# Patient Record
Sex: Female | Born: 1985 | Hispanic: Yes | Marital: Married | Smoking: Never smoker
Health system: Southern US, Community
[De-identification: ages and names within clinical notes are randomized; demographics above are authoritative.]

## PROBLEM LIST (undated history)

## (undated) HISTORY — PX: GASTRIC BYPASS: SHX52

## (undated) HISTORY — PX: CHOLECYSTECTOMY: SHX55

## (undated) HISTORY — PX: TONSILLECTOMY: SUR1361

## (undated) HISTORY — PX: APPENDECTOMY: SHX54

---

## 2020-05-09 ENCOUNTER — Emergency Department
Admission: EM | Admit: 2020-05-09 | Discharge: 2020-05-09 | Disposition: A | Discharge status: Other Acute Inpatient Hospital | Payer: Self-pay

## 2020-05-10 ENCOUNTER — Other Ambulatory Visit: Payer: Self-pay

## 2020-05-10 ENCOUNTER — Ambulatory Visit (INDEPENDENT_AMBULATORY_CARE_PROVIDER_SITE_OTHER): Payer: Self-pay

## 2020-05-10 ENCOUNTER — Encounter: Payer: Self-pay | Admitting: Emergency Medicine

## 2020-05-10 ENCOUNTER — Ambulatory Visit
Admission: EM | Admit: 2020-05-10 | Discharge: 2020-05-10 | Disposition: A | Discharge status: Home / Self Care | Payer: Self-pay

## 2020-05-10 DIAGNOSIS — S93402A Sprain of unspecified ligament of left ankle, initial encounter: Secondary | ICD-10-CM

## 2020-05-10 MED ORDER — IBUPROFEN 800 MG PO TABS
800.0000 mg | ORAL_TABLET | Freq: Three times a day (TID) | ORAL | 0 refills | Status: DC
Start: 2020-05-10 — End: 2023-05-22

## 2020-05-10 NOTE — ED Provider Notes (Signed)
[facility]    CSN: [id number] Arrival date & time: 05/10/20  0935      History   Chief Complaint Chief Complaint  Patient presents with  . Foot Pain  . Fall  . Ankle Pain    HPI Christina Park is a 34 y.o. female. who presents with L ankle and foot  pain after twisting her ankle at home. She is moving and jumped over some items and as she landed her ankle twisted inwards.     History reviewed. No pertinent past medical history.  There are no problems to display for this patient.   Past Surgical History:  Procedure Laterality Date  . APPENDECTOMY    . CHOLECYSTECTOMY    . GASTRIC BYPASS    . TONSILLECTOMY      OB History   No obstetric history on file.      Home Medications    Prior to Admission medications   Medication Sig Start Date End Date Taking? Authorizing Provider  levonorgestrel (MIRENA) 20 MCG/24HR IUD 1 each by Intrauterine route once.   Yes [provider]  Multiple Vitamin (MULTIVITAMIN) capsule Take 1 capsule by mouth daily.   Yes [provider]  ibuprofen (ADVIL) 800 MG tablet Take 1 tablet (800 mg total) by mouth 3 (three) times daily. 05/10/20   Rodriguez-Southworth, Nettie Elm, PA-C    Family History Family History  Problem Relation Age of Onset  . Healthy Mother   . Healthy Father     Social History Social History   Tobacco Use  . Smoking status: Never Smoker  . Smokeless tobacco: Never Used  Vaping Use  . Vaping Use: Never used  Substance Use Topics  . Alcohol use: Not Currently  . Drug use: Never     Allergies   Patient has no known allergies.   Review of Systems Review of Systems  Musculoskeletal: Positive for arthralgias and gait problem. Negative for back pain, joint swelling, myalgias and neck pain.  Skin: Negative for color change, pallor, rash and wound.  Neurological: Negative for numbness.     Physical Exam Triage Vital Signs ED Triage Vitals  Enc Vitals Group     BP  05/10/20 0951 109/76     Pulse Rate 05/10/20 0951 91     Resp 05/10/20 0951 14     Temp 05/10/20 0951 98 F (36.7 C)     Temp Source 05/10/20 0951 Oral     SpO2 05/10/20 0951 100 %     Weight 05/10/20 0947 200 lb (90.7 kg)     Height 05/10/20 0947 5' 4.96" (1.65 m)     Head Circumference --      Peak Flow --      Pain Score 05/10/20 0947 6     Pain Loc --      Pain Edu? --      Excl. in GC? --    No data found.  Updated Vital Signs BP 109/76 (BP Location: Left Arm)   Pulse 91   Temp 98 F (36.7 C) (Oral)   Resp 14   Ht 5' 4.96" (1.65 m)   Wt 200 lb (90.7 kg)   SpO2 100%   BMI 33.32 kg/m   Visual Acuity Right Eye Distance:   Left Eye Distance:   Bilateral Distance:    Right Eye Near:   Left Eye Near:    Bilateral Near:     Physical Exam Vitals and nursing note reviewed.  Constitutional:  General: She is not in acute distress.    Appearance: She is obese. She is not toxic-appearing.     Comments: She is sitting on a wheelchair since she cant bare weight due to the pain.   HENT:     Head: Atraumatic.     Right Ear: External ear normal.     Left Ear: External ear normal.  Eyes:     General: No scleral icterus.    Conjunctiva/sclera: Conjunctivae normal.  Cardiovascular:     Pulses: Normal pulses.  Pulmonary:     Effort: Pulmonary effort is normal.  Musculoskeletal:        General: Swelling present.     Cervical back: Neck supple.     Comments: L ANKLE- with moderate swelling on the lateral malleolus which is very tender to palpation. ROM is limited due to pain.  L FOOT- has mild pain in the dorsal proximal region of the foot, but the rest is negative.   Skin:    General: Skin is warm and dry.     Capillary Refill: Capillary refill takes less than 2 seconds.     Findings: Bruising present. No rash.     Comments: Faint bruising noted on lateral malleolus  Neurological:     Mental Status: She is alert and oriented to person, place, and time.     Gait:  Gait abnormal.     Deep Tendon Reflexes: Reflexes normal.  Psychiatric:        Mood and Affect: Mood normal.        Behavior: Behavior normal.        Thought Content: Thought content normal.        Judgment: Judgment normal.      UC Treatments / Results  Labs (all labs ordered are listed, but only abnormal results are displayed) Labs Reviewed - No data to display  EKG   Radiology DG Ankle Complete Left  Result Date: 05/10/2020 CLINICAL DATA:  Left foot and left ankle pain and swelling. Status post fall EXAM: LEFT ANKLE COMPLETE - 3+ VIEW COMPARISON:  None. FINDINGS: There is lateral soft tissue swelling overlying the lateral malleolus. No underlying fracture or dislocation. No radio-opaque foreign bodies. IMPRESSION: Lateral soft tissue swelling.  No underlying fracture. Electronically Signed   By: Signa Kell M.D.   On: 05/10/2020 10:35   DG Foot Complete Left  Result Date: 05/10/2020 CLINICAL DATA:  Status post fall yesterday now with foot and ankle swelling. EXAM: LEFT FOOT - COMPLETE 3+ VIEW COMPARISON:  None. FINDINGS: Diffuse soft tissue swelling is identified. No underlying fracture or subluxation identified. No radiopaque foreign bodies. IMPRESSION: Diffuse soft tissue swelling.  No fracture identified. Electronically Signed   By: Signa Kell M.D.   On: 05/10/2020 10:36    Procedures Procedures (including critical care time)  Medications Ordered in UC Medications - No data to display  Initial Impression / Assessment and Plan / UC Course  I have reviewed the triage vital signs and the nursing notes. Pertinent  imaging results that were available during my care of the patient were reviewed by me and considered in my medical decision making (see chart for details). Has L ankle sprain. She was placed on ankle brace and given crutches. Advised to gradually bare weight as tolerated. Also taught how to do some ankle ROM exercises.  Final Clinical Impressions(s) / UC  Diagnoses   Final diagnoses:  Sprain of left ankle, unspecified ligament, initial encounter     Discharge Instructions  Apply ice for 20 minutes over a cloth 3-4 times a day for 2 days. Rest and elevate the affected painful area.  As pain recedes, begin normal activities slowly as tolerated.   Go to [facility] if symptoms worsen.     ED Prescriptions    Medication Sig Dispense Auth. Provider   ibuprofen (ADVIL) 800 MG tablet Take 1 tablet (800 mg total) by mouth 3 (three) times daily. 21 tablet Rodriguez-Southworth, Nettie Elm, PA-C     PDMP not reviewed this encounter.   Garey Ham, [place] 05/11/20 8295

## 2020-05-10 NOTE — ED Triage Notes (Signed)
Patient states that she fell at her house yesterday and twisted her left ankle.  Patient c/o pain in her left ankle and left foot.

## 2020-05-10 NOTE — Discharge Instructions (Addendum)
Apply ice for 20 minutes over a cloth 3-4 times a day for 2 days. Rest and elevate the affected painful area.  As pain recedes, begin normal activities slowly as tolerated.   Go to [facility] if symptoms worsen.

## 2021-02-11 IMAGING — CR DG ANKLE COMPLETE 3+V*L*
3 series · 3 of 3 positions shown · non-contrast
Comparison: None.

CLINICAL DATA: Left foot and left ankle pain and swelling. Status
post fall

EXAM:
LEFT ANKLE COMPLETE - 3+ VIEW

[ankle ap]
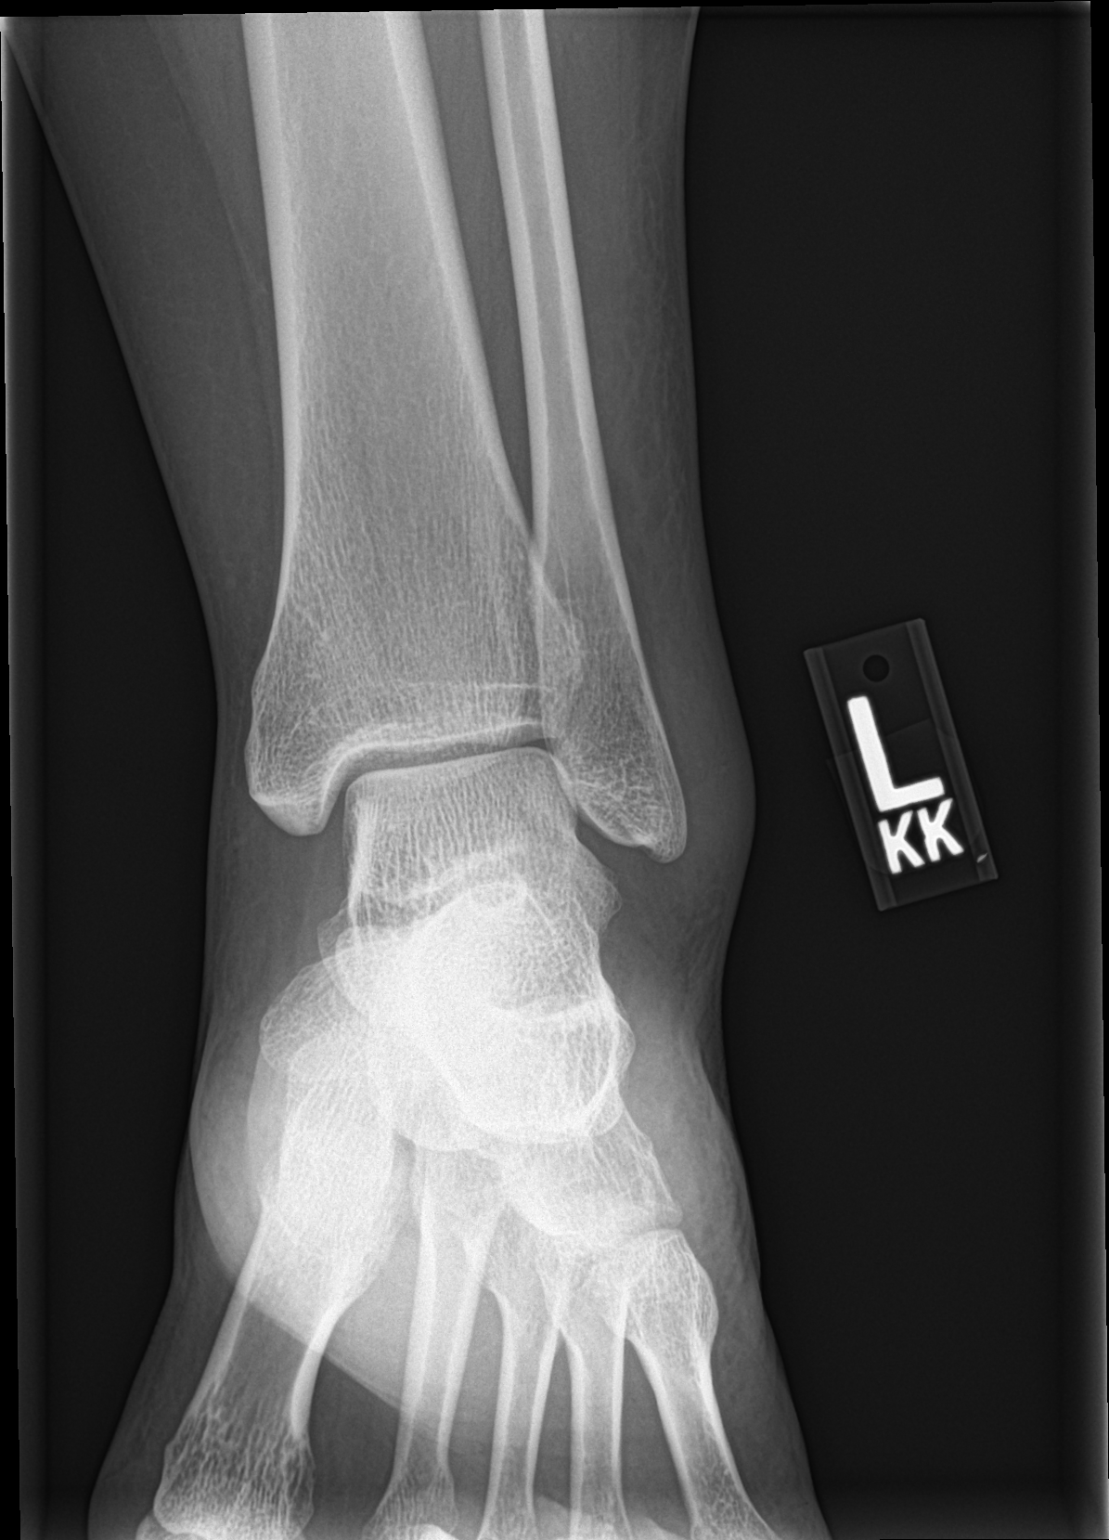

[ankle obl]
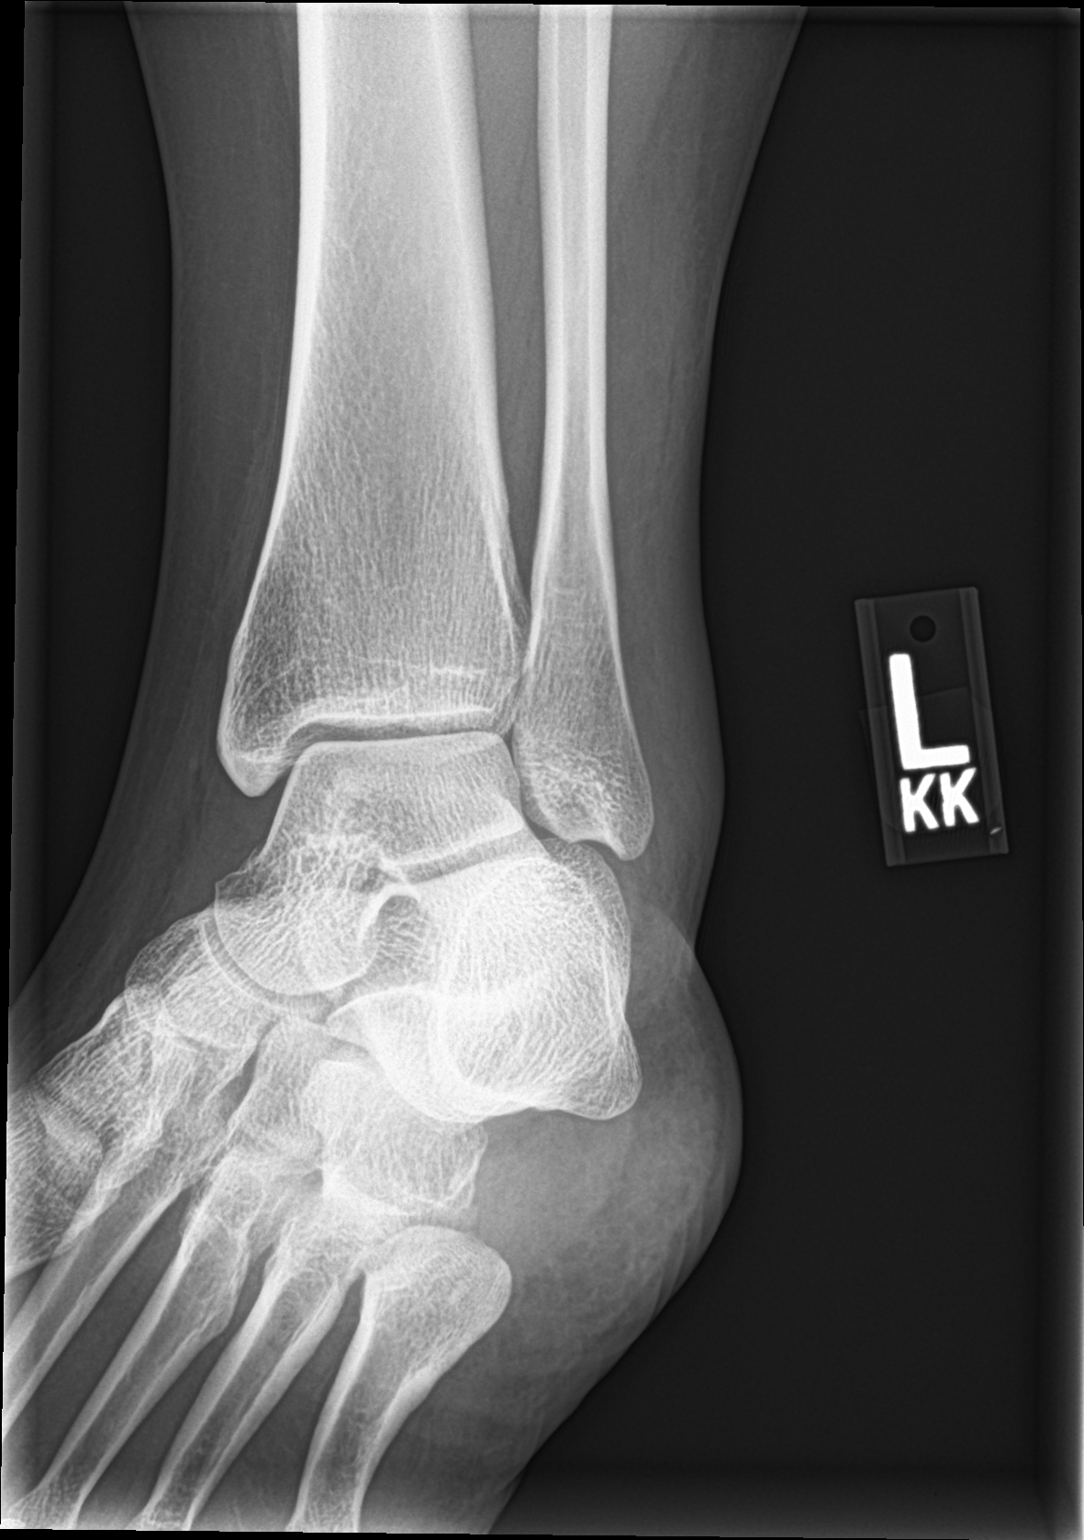

[ankle lat]
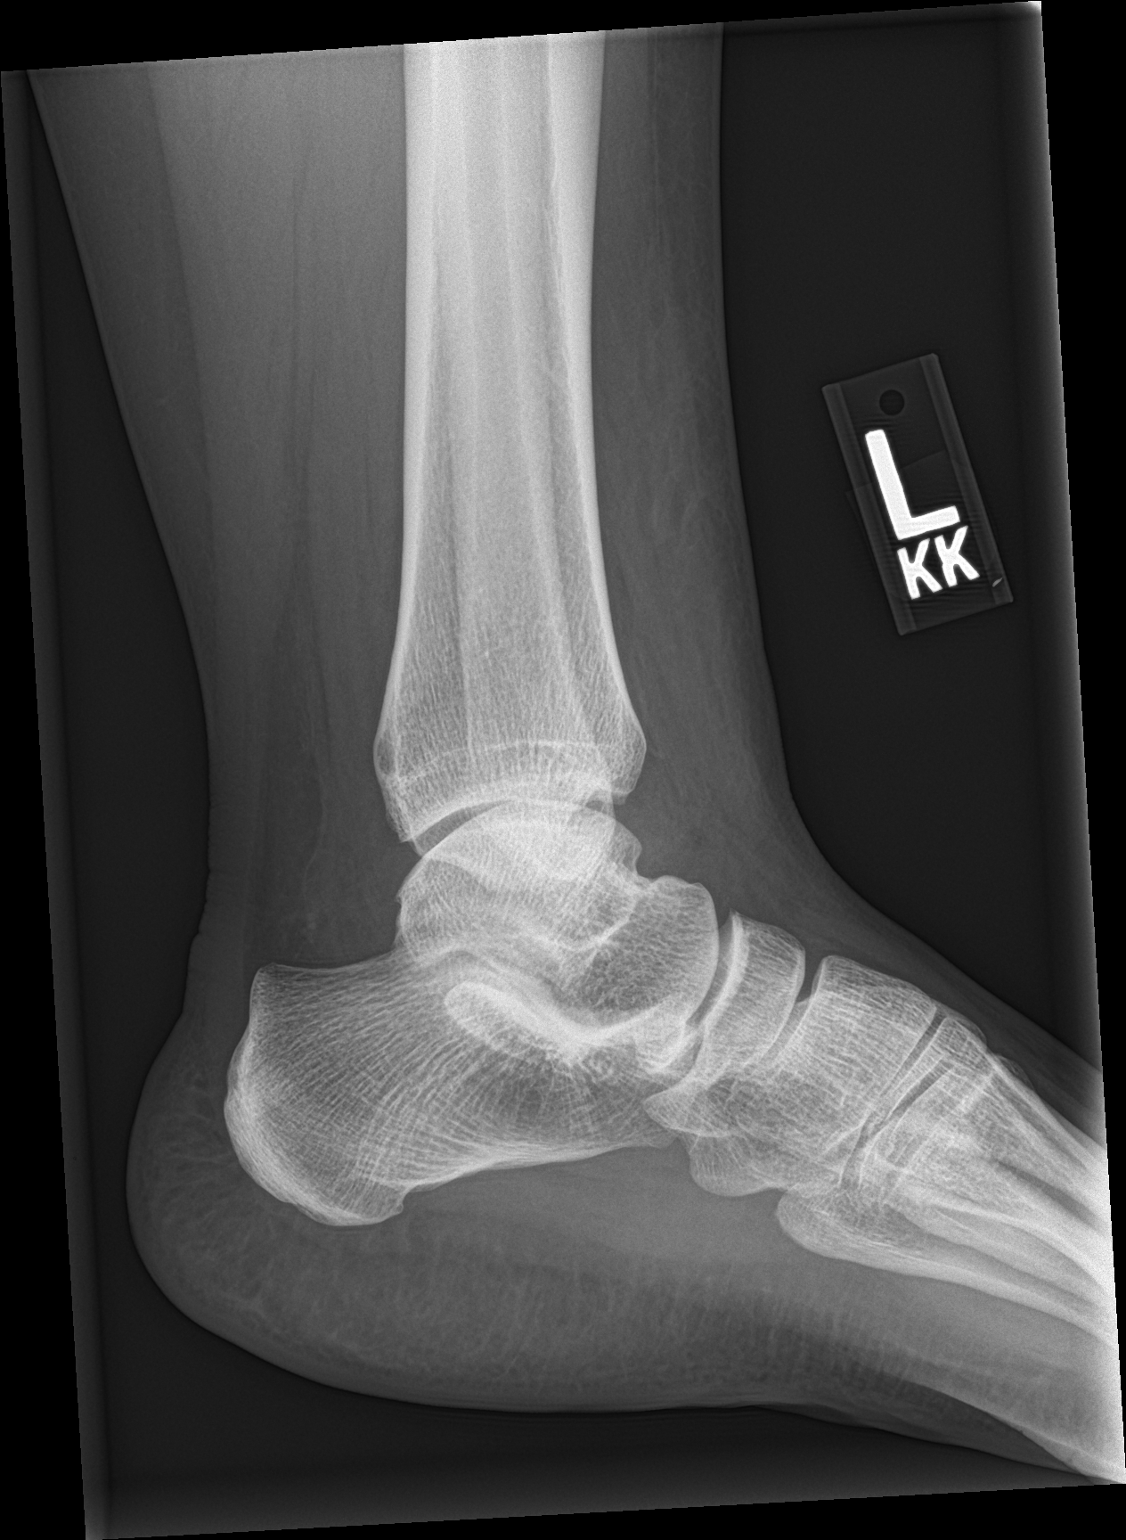

[3 of 3 positions shown; findings below may reference images not displayed]

FINDINGS: There is lateral soft tissue swelling overlying the lateral
malleolus. No underlying fracture or dislocation. No radio-opaque
foreign bodies.
IMPRESSION: Lateral soft tissue swelling.  No underlying fracture.

## 2021-02-11 IMAGING — CR DG FOOT COMPLETE 3+V*L*
3 series · 3 of 3 positions shown · non-contrast
Comparison: None.

CLINICAL DATA: Status post fall yesterday now with foot and ankle
swelling.

EXAM:
LEFT FOOT - COMPLETE 3+ VIEW

[foot ap]
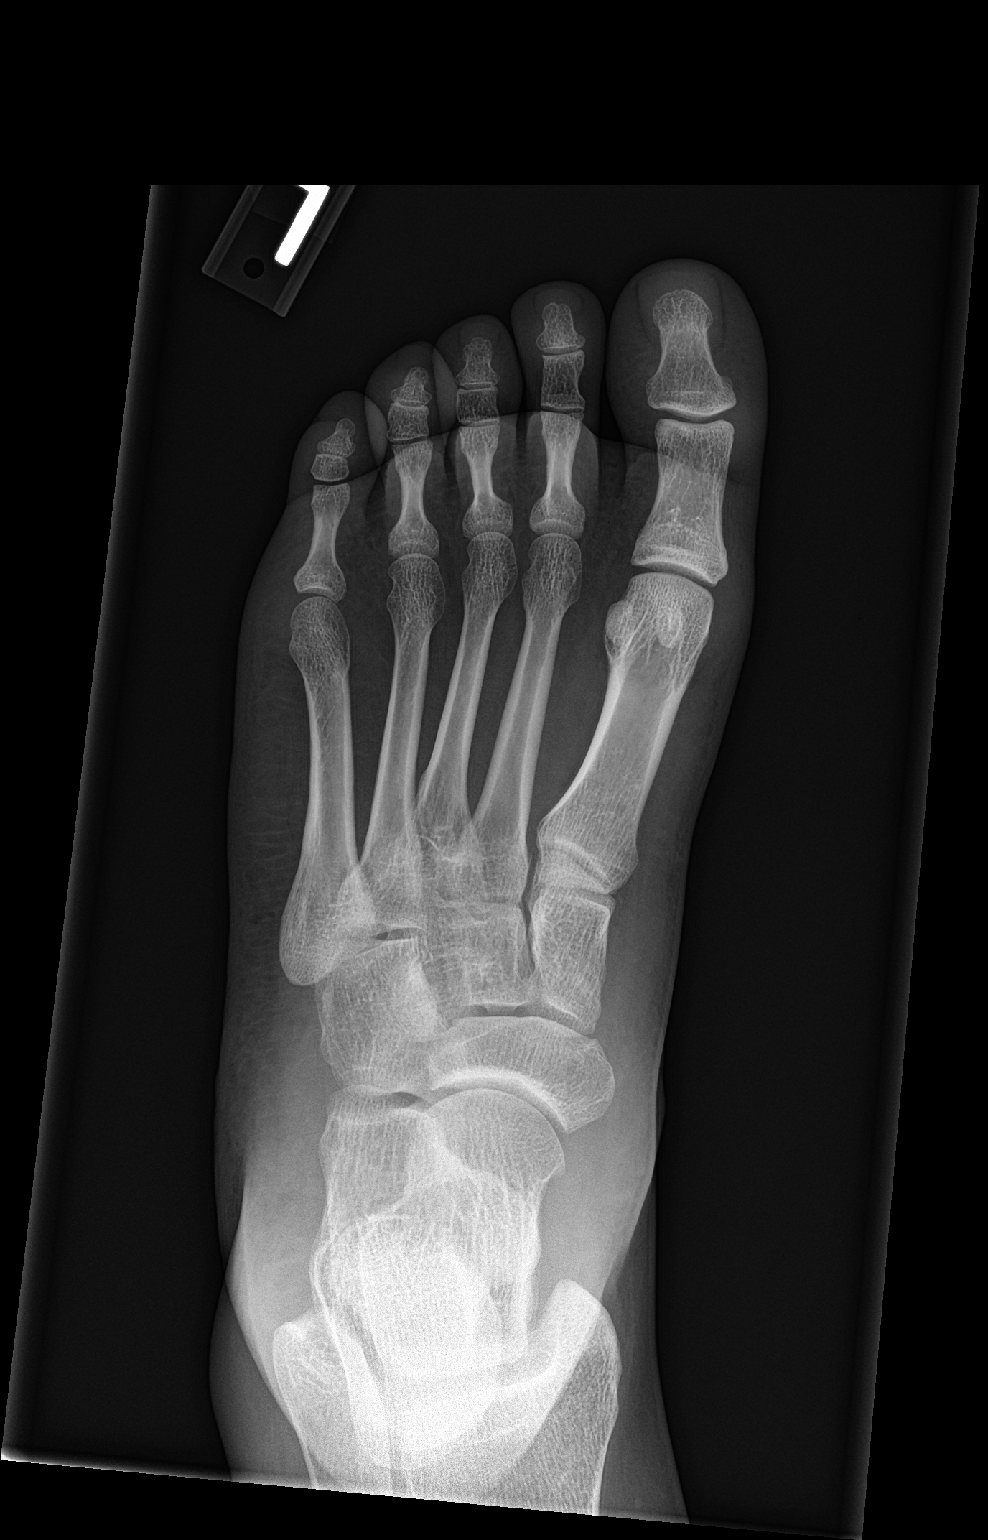

[foot obl]
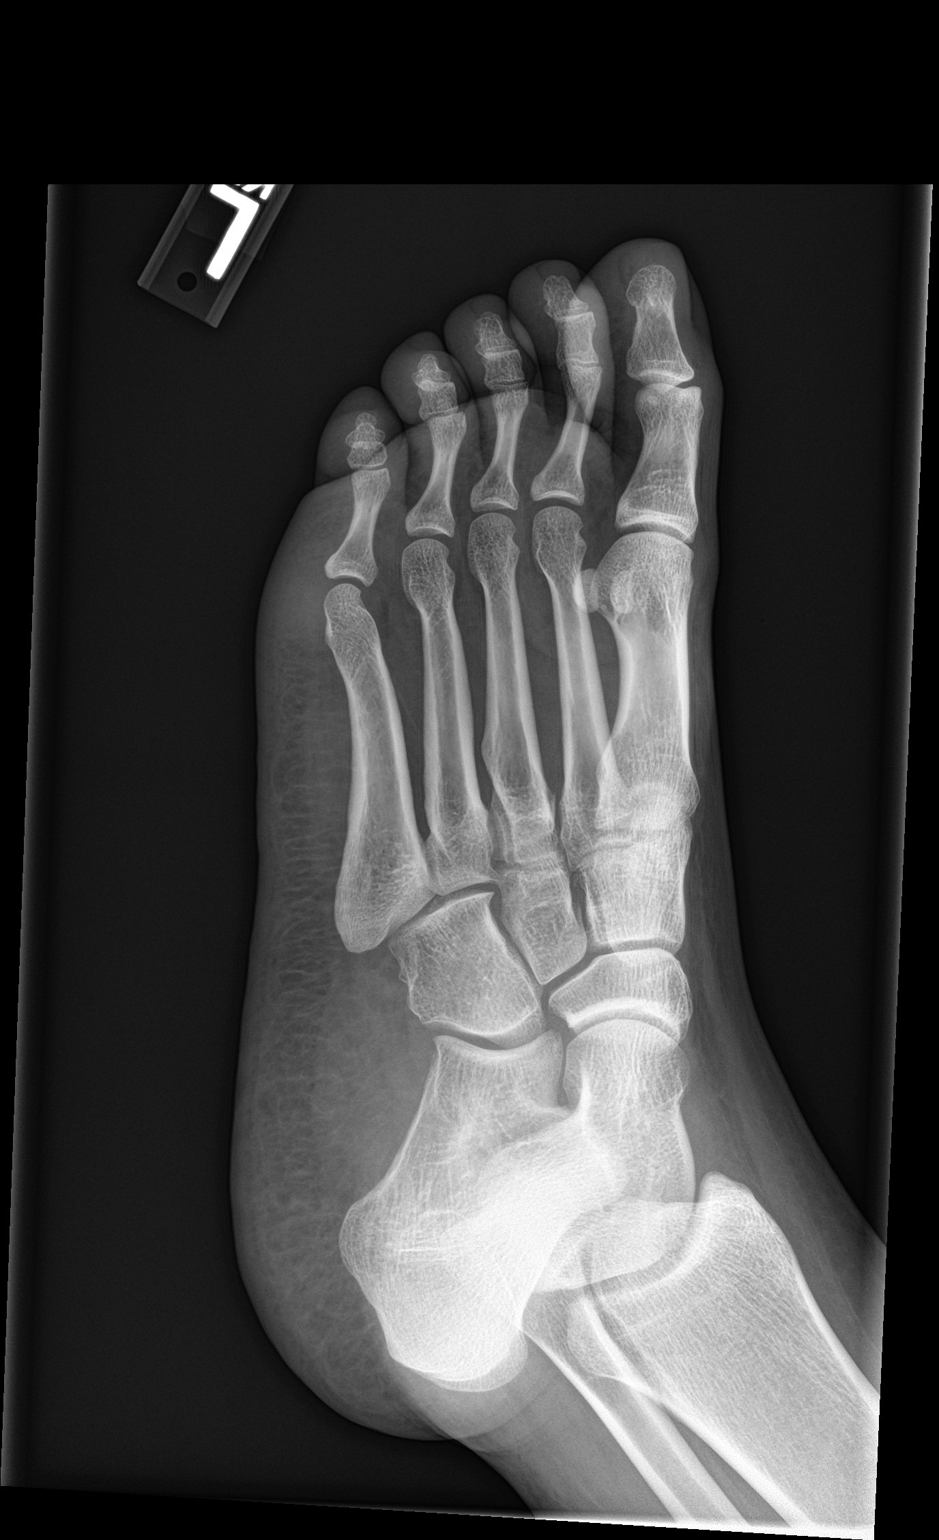

[foot lat]
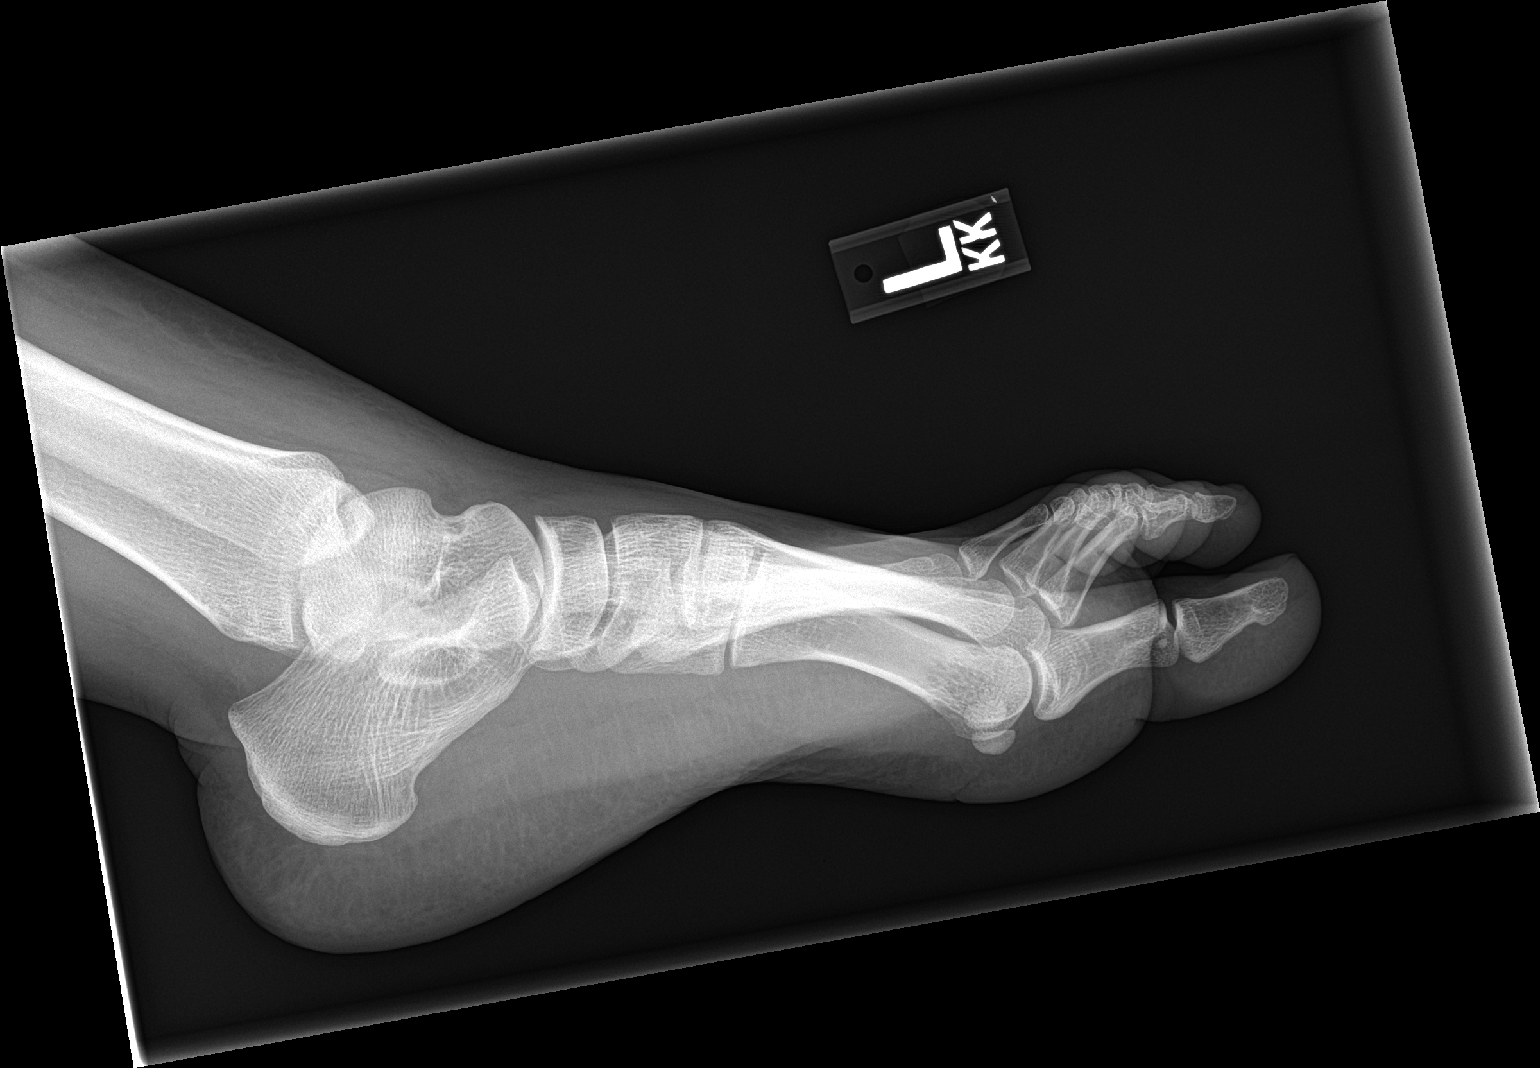

[3 of 3 positions shown; findings below may reference images not displayed]

FINDINGS: Diffuse soft tissue swelling is identified. No underlying fracture
or subluxation identified. No radiopaque foreign bodies.
IMPRESSION: Diffuse soft tissue swelling.  No fracture identified.

## 2023-05-18 ENCOUNTER — Other Ambulatory Visit: Payer: Self-pay

## 2023-05-18 ENCOUNTER — Ambulatory Visit
Admission: EM | Admit: 2023-05-18 | Discharge: 2023-05-18 | Disposition: A | Discharge status: Home / Self Care | Payer: Self-pay | Attending: Family Medicine | Admitting: Family Medicine

## 2023-05-18 ENCOUNTER — Inpatient Hospital Stay
Admission: EM | Admit: 2023-05-18 | Discharge: 2023-05-22 | DRG: 872 | Disposition: A | Discharge status: Home / Self Care | Payer: Self-pay | Attending: Internal Medicine | Admitting: Internal Medicine

## 2023-05-18 ENCOUNTER — Ambulatory Visit (INDEPENDENT_AMBULATORY_CARE_PROVIDER_SITE_OTHER): Payer: Self-pay

## 2023-05-18 ENCOUNTER — Emergency Department: Payer: Self-pay

## 2023-05-18 DIAGNOSIS — Z9049 Acquired absence of other specified parts of digestive tract: Secondary | ICD-10-CM

## 2023-05-18 DIAGNOSIS — Z1152 Encounter for screening for COVID-19: Secondary | ICD-10-CM | POA: Insufficient documentation

## 2023-05-18 DIAGNOSIS — R7989 Other specified abnormal findings of blood chemistry: Secondary | ICD-10-CM | POA: Diagnosis present

## 2023-05-18 DIAGNOSIS — R519 Headache, unspecified: Secondary | ICD-10-CM | POA: Insufficient documentation

## 2023-05-18 DIAGNOSIS — R7309 Other abnormal glucose: Secondary | ICD-10-CM | POA: Diagnosis present

## 2023-05-18 DIAGNOSIS — E860 Dehydration: Secondary | ICD-10-CM | POA: Diagnosis present

## 2023-05-18 DIAGNOSIS — D649 Anemia, unspecified: Secondary | ICD-10-CM | POA: Diagnosis present

## 2023-05-18 DIAGNOSIS — R1031 Right lower quadrant pain: Secondary | ICD-10-CM

## 2023-05-18 DIAGNOSIS — R509 Fever, unspecified: Secondary | ICD-10-CM

## 2023-05-18 DIAGNOSIS — E876 Hypokalemia: Secondary | ICD-10-CM | POA: Insufficient documentation

## 2023-05-18 DIAGNOSIS — R1033 Periumbilical pain: Secondary | ICD-10-CM | POA: Insufficient documentation

## 2023-05-18 DIAGNOSIS — A419 Sepsis, unspecified organism: Principal | ICD-10-CM | POA: Diagnosis present

## 2023-05-18 DIAGNOSIS — N12 Tubulo-interstitial nephritis, not specified as acute or chronic: Principal | ICD-10-CM

## 2023-05-18 DIAGNOSIS — D72829 Elevated white blood cell count, unspecified: Secondary | ICD-10-CM | POA: Insufficient documentation

## 2023-05-18 DIAGNOSIS — R1110 Vomiting, unspecified: Secondary | ICD-10-CM | POA: Insufficient documentation

## 2023-05-18 DIAGNOSIS — E871 Hypo-osmolality and hyponatremia: Secondary | ICD-10-CM | POA: Insufficient documentation

## 2023-05-18 DIAGNOSIS — R1084 Generalized abdominal pain: Secondary | ICD-10-CM | POA: Insufficient documentation

## 2023-05-18 DIAGNOSIS — Z9884 Bariatric surgery status: Secondary | ICD-10-CM

## 2023-05-18 DIAGNOSIS — J111 Influenza due to unidentified influenza virus with other respiratory manifestations: Secondary | ICD-10-CM

## 2023-05-18 DIAGNOSIS — N10 Acute tubulo-interstitial nephritis: Secondary | ICD-10-CM | POA: Diagnosis present

## 2023-05-18 DIAGNOSIS — R109 Unspecified abdominal pain: Secondary | ICD-10-CM | POA: Diagnosis present

## 2023-05-18 LAB — CBC WITH DIFFERENTIAL/PLATELET
Abs Immature Granulocytes: 0.06 10*3/uL (ref 0.00–0.07)
Abs Immature Granulocytes: 0.07 10*3/uL (ref 0.00–0.07)
Basophils Absolute: 0 10*3/uL (ref 0.0–0.1)
Basophils Absolute: 0 10*3/uL (ref 0.0–0.1)
Basophils Relative: 0 %
Basophils Relative: 0 %
Eosinophils Absolute: 0 10*3/uL (ref 0.0–0.5)
Eosinophils Absolute: 0 10*3/uL (ref 0.0–0.5)
Eosinophils Relative: 0 %
Eosinophils Relative: 0 %
HCT: 38.5 % (ref 36.0–46.0)
HCT: 35 % — ABNORMAL LOW (ref 36.0–46.0)
Hemoglobin: 13.2 g/dL (ref 12.0–15.0)
Hemoglobin: 11.7 g/dL — ABNORMAL LOW (ref 12.0–15.0)
Immature Granulocytes: 1 %
Immature Granulocytes: 1 %
Lymphocytes Relative: 12 %
Lymphocytes Relative: 10 %
Lymphs Abs: 1.4 10*3/uL (ref 0.7–4.0)
Lymphs Abs: 1 10*3/uL (ref 0.7–4.0)
MCH: 31.1 pg (ref 26.0–34.0)
MCH: 31 pg (ref 26.0–34.0)
MCHC: 34.3 g/dL (ref 30.0–36.0)
MCHC: 33.4 g/dL (ref 30.0–36.0)
MCV: 90.8 fL (ref 80.0–100.0)
MCV: 92.8 fL (ref 80.0–100.0)
Monocytes Absolute: 1.3 10*3/uL — ABNORMAL HIGH (ref 0.1–1.0)
Monocytes Absolute: 1.5 10*3/uL — ABNORMAL HIGH (ref 0.1–1.0)
Monocytes Relative: 12 %
Monocytes Relative: 14 %
Neutro Abs: 8.3 10*3/uL — ABNORMAL HIGH (ref 1.7–7.7)
Neutro Abs: 7.9 10*3/uL — ABNORMAL HIGH (ref 1.7–7.7)
Neutrophils Relative %: 75 %
Neutrophils Relative %: 75 %
Platelets: 250 10*3/uL (ref 150–400)
Platelets: 228 10*3/uL (ref 150–400)
RBC: 4.24 MIL/uL (ref 3.87–5.11)
RBC: 3.77 MIL/uL — ABNORMAL LOW (ref 3.87–5.11)
RDW: 12.8 % (ref 11.5–15.5)
RDW: 12.6 % (ref 11.5–15.5)
WBC: 11.1 10*3/uL — ABNORMAL HIGH (ref 4.0–10.5)
WBC: 10.5 10*3/uL (ref 4.0–10.5)
nRBC: 0 % (ref 0.0–0.2)
nRBC: 0 % (ref 0.0–0.2)

## 2023-05-18 LAB — URINALYSIS, ROUTINE W REFLEX MICROSCOPIC
Bacteria, UA: NONE SEEN
Bilirubin Urine: NEGATIVE
Glucose, UA: NEGATIVE mg/dL
Glucose, UA: NEGATIVE mg/dL
Ketones, ur: 80 mg/dL — AB
Ketones, ur: 5 mg/dL — AB
Leukocytes,Ua: NEGATIVE
Leukocytes,Ua: NEGATIVE
Nitrite: NEGATIVE
Nitrite: NEGATIVE
Protein, ur: 30 mg/dL — AB
Protein, ur: NEGATIVE mg/dL
Specific Gravity, Urine: 1.025 (ref 1.005–1.030)
Specific Gravity, Urine: >1.046 — ABNORMAL HIGH (ref 1.005–1.030)
pH: 5.5 (ref 5.0–8.0)
pH: 6 (ref 5.0–8.0)

## 2023-05-18 LAB — COMPREHENSIVE METABOLIC PANEL
ALT: 147 U/L — ABNORMAL HIGH (ref 0–44)
AST: 103 U/L — ABNORMAL HIGH (ref 15–41)
Albumin: 3.3 g/dL — ABNORMAL LOW (ref 3.5–5.0)
Alkaline Phosphatase: 89 U/L (ref 38–126)
Anion gap: 4 — ABNORMAL LOW (ref 5–15)
BUN: 10 mg/dL (ref 6–20)
CO2: 24 mmol/L (ref 22–32)
Calcium: 7.9 mg/dL — ABNORMAL LOW (ref 8.9–10.3)
Chloride: 106 mmol/L (ref 98–111)
Creatinine, Ser: 0.78 mg/dL (ref 0.44–1.00)
GFR, Estimated: >60 mL/min (ref >60)
Glucose, Bld: 109 mg/dL — ABNORMAL HIGH (ref 70–99)
Potassium: 3.5 mmol/L (ref 3.5–5.1)
Sodium: 134 mmol/L — ABNORMAL LOW (ref 135–145)
Total Bilirubin: 1 mg/dL (ref 0.3–1.2)
Total Protein: 7.1 g/dL (ref 6.5–8.1)

## 2023-05-18 LAB — LACTIC ACID, PLASMA: Lactic Acid, Venous: 1 mmol/L (ref 0.5–1.9)

## 2023-05-18 LAB — BASIC METABOLIC PANEL
Anion gap: 11 (ref 5–15)
BUN: 8 mg/dL (ref 6–20)
CO2: 23 mmol/L (ref 22–32)
Calcium: 8.9 mg/dL (ref 8.9–10.3)
Chloride: 99 mmol/L (ref 98–111)
Creatinine, Ser: 0.88 mg/dL (ref 0.44–1.00)
GFR, Estimated: >60 mL/min (ref >60)
Glucose, Bld: 116 mg/dL — ABNORMAL HIGH (ref 70–99)
Potassium: 3.9 mmol/L (ref 3.5–5.1)
Sodium: 133 mmol/L — ABNORMAL LOW (ref 135–145)

## 2023-05-18 LAB — RAPID INFLUENZA A&B ANTIGENS
Influenza A (ARMC): NEGATIVE
Influenza B (ARMC): NEGATIVE

## 2023-05-18 LAB — URINALYSIS, MICROSCOPIC (REFLEX)

## 2023-05-18 LAB — SARS CORONAVIRUS 2 BY RT PCR: SARS Coronavirus 2 by RT PCR: NEGATIVE

## 2023-05-18 LAB — GROUP A STREP BY PCR: Group A Strep by PCR: NOT DETECTED

## 2023-05-18 MED ORDER — IOHEXOL 350 MG/ML SOLN
100.0000 mL | Freq: Once | INTRAVENOUS | Status: AC | PRN
  Administered 2023-05-18: 100 mL via INTRAVENOUS

## 2023-05-18 MED ORDER — SODIUM CHLORIDE 0.9 % IV BOLUS
1000.0000 mL | Freq: Once | INTRAVENOUS | Status: AC
  Administered 2023-05-18: 1000 mL via INTRAVENOUS

## 2023-05-18 MED ORDER — IBUPROFEN 800 MG PO TABS
800.0000 mg | ORAL_TABLET | Freq: Once | ORAL | Status: AC
  Administered 2023-05-18: 800 mg via ORAL

## 2023-05-18 MED ORDER — SODIUM CHLORIDE 0.9 % IV SOLN
1.0000 g | Freq: Once | INTRAVENOUS | Status: AC
  Administered 2023-05-18: 1 g via INTRAVENOUS
  Filled 2023-05-18: qty 10

## 2023-05-18 MED ORDER — SODIUM CHLORIDE 0.9 % IV SOLN: Freq: Once | INTRAVENOUS | Status: AC

## 2023-05-18 MED ORDER — MORPHINE SULFATE (PF) 4 MG/ML IV SOLN
4.0000 mg | INTRAVENOUS | Status: DC | PRN
  Administered 2023-05-19 (×3): 4 mg via INTRAVENOUS
  Filled 2023-05-18 (×3): qty 1

## 2023-05-18 MED ORDER — ONDANSETRON HCL 4 MG/2ML IJ SOLN
4.0000 mg | Freq: Once | INTRAMUSCULAR | Status: AC
  Administered 2023-05-18: 4 mg via INTRAVENOUS
  Filled 2023-05-18: qty 2

## 2023-05-18 NOTE — ED Provider Notes (Addendum)
[facility]    CSN: [id number] Arrival date & time: 05/18/23  1559      History   Chief Complaint Chief Complaint  Patient presents with   Fever   Headache   Generalized Body Aches   Emesis    HPI Christina Park is a 37 y.o. female.   HPI  History obtained from  husband and the patient. Christina Park presents for fever, headache, body aches, vomiting that started on Monday.  Tmax 105 F.   Has cough.  Has not traveled recently.  They have a six year old at home. No known sick contacts.  Tylenol last taken at 1 PM and Motrin at 9 AM.  Has periumbilical abdominal pain. Her urine is orange. She has been sweating.    Fever :yes Chills: yes Sore throat: yes Cough: yes Sputum: no Chest tightness: no Shortness of breath: no Wheezing: no  Nasal congestion : no  Rhinorrhea: no Myalgias: yes Appetite: decreased  Hydration: normal  Abdominal pain: yes  Nausea: yes Vomiting: yes Diarrhea: No Rash: No Sleep disturbance: no Headache: yes  Neck pain / stiffness: no      History reviewed. No pertinent past medical history.  Patient Active Problem List   Diagnosis Date Noted   Acute right flank pain 05/19/2023   Sepsis secondary to UTI (HCC) 05/19/2023   Abnormal LFTs 05/19/2023   Anemia 05/19/2023   Acute pyelonephritis 05/19/2023   Elevated glucose level 05/19/2023   Hyponatremia 05/19/2023   Hypokalemia 05/19/2023   Headache 05/19/2023   Pyelonephritis 05/19/2023    Past Surgical History:  Procedure Laterality Date   APPENDECTOMY     CHOLECYSTECTOMY     GASTRIC BYPASS     TONSILLECTOMY      OB History   No obstetric history on file.      Home Medications    Prior to Admission medications   Medication Sig Start Date End Date Taking? Authorizing Provider  ibuprofen (ADVIL) 800 MG tablet Take 1 tablet (800 mg total) by mouth 3 (three) times daily. 05/10/20   Rodriguez-Southworth, Nettie Elm, PA-C  levonorgestrel (MIRENA) 20 MCG/24HR IUD 1 each  by Intrauterine route once.    [provider]  Multiple Vitamin (MULTIVITAMIN) capsule Take 1 capsule by mouth daily.    [provider]    Family History Family History  Problem Relation Age of Onset   Healthy Mother    Healthy Father     Social History Social History   Tobacco Use   Smoking status: Never   Smokeless tobacco: Never  Vaping Use   Vaping status: Never Used  Substance Use Topics   Alcohol use: Not Currently   Drug use: Never     Allergies   No known allergies   Review of Systems Review of Systems: negative unless otherwise stated in HPI.      Physical Exam Triage Vital Signs ED Triage Vitals  Encounter Vitals Group     BP 05/18/23 1626 109/74     Systolic BP Percentile --      Diastolic BP Percentile --      Pulse Rate 05/18/23 1626 (!) 107     Resp 05/18/23 1626 16     Temp 05/18/23 1626 99.4 F (37.4 C)     Temp Source 05/18/23 1626 Oral     SpO2 05/18/23 1626 100 %     Weight --      Height --      Head Circumference --  Peak Flow --      Pain Score 05/18/23 1625 6     Pain Loc --      Pain Education --      Exclude from Growth Chart --    No data found.  Updated Vital Signs BP 109/74 (BP Location: Left Arm)   Pulse (!) 107   Temp (!) 100.5 F (38.1 C) (Oral)   Resp 16   SpO2 100%   Visual Acuity Right Eye Distance:   Left Eye Distance:   Bilateral Distance:    Right Eye Near:   Left Eye Near:    Bilateral Near:     Physical Exam GEN:     alert, ill appearing female, diaphoretic   HENT:  mucus membranes moist, oropharyngeal without lesions or erythema, no tonsillar hypertrophy or exudates,  moderate erythematous edematous turbinates, clear nasal discharge, bilateral TM normal EYES:   pupils equal and reactive, no scleral injection or discharge NECK:  normal ROM, no lymphadenopathy, no meningismus   RESP:  no increased work of breathing, clear to auscultation bilaterally CVS:   regular rhythm,  tachycardic ABD:   Soft, RLQ TTP, nondistended, no guarding, no rebound, active bowel sounds throughout, negative McBurney's, negative Murphy's Skin:   warm and clammy, no rash on visible skin    UC Treatments / Results  Labs (all labs ordered are listed, but only abnormal results are displayed) Labs Reviewed  BASIC METABOLIC PANEL - Abnormal; Notable for the following components:      Result Value   Sodium 133 (*)    Glucose, Bld 116 (*)    All other components within normal limits  CBC WITH DIFFERENTIAL/PLATELET - Abnormal; Notable for the following components:   WBC 11.1 (*)    Neutro Abs 8.3 (*)    Monocytes Absolute 1.3 (*)    All other components within normal limits  URINALYSIS, ROUTINE W REFLEX MICROSCOPIC - Abnormal; Notable for the following components:   Color, Urine ORANGE (*)    APPearance HAZY (*)    Hgb urine dipstick MODERATE (*)    Bilirubin Urine MODERATE (*)    Ketones, ur 80 (*)    Protein, ur 30 (*)    All other components within normal limits  URINALYSIS, MICROSCOPIC (REFLEX) - Abnormal; Notable for the following components:   Bacteria, UA MANY (*)    All other components within normal limits  SARS CORONAVIRUS 2 BY RT PCR  GROUP A STREP BY PCR  RAPID INFLUENZA A&B ANTIGENS    EKG   Radiology No results found.  Procedures Procedures (including critical care time)  Medications Ordered in UC Medications  ibuprofen (ADVIL) tablet 800 mg (800 mg Oral Given 05/18/23 1737)    Initial Impression / Assessment and Plan / UC Course  I have reviewed the triage vital signs and the nursing notes.  Pertinent labs & imaging results that were available during my care of the patient were reviewed by me and considered in my medical decision making (see chart for details).       Pt is a 37 y.o. female who presents for 3 days of flulike symptoms. Christina Park is febrile here, 100.5 F despite recent antipyretics.  Given 800 mg ibuprofen.  She is tachycardic.   Satting well on room air. Overall pt is ill appearing, well hydrated, without respiratory distress.  On exam, she has mild RLQ abdominal tenderness, HEENT grossly unremarkable, her skin is moist and she is tachycardic.    CBC showing mild leukocytosis  with left shift, WBC 11.1.  BMP showing mild hyponatremia, sodium 130 and glucose of 116. COVID and influenza testing obtained and were negative. Chest xray personally reviewed by me without focal pneumonia, pleural effusion, cardiomegaly or pneumothorax.  Urinalysis showing ketonuria, proteinuria, hemoglobinuria without acute urinary urinary tract infection though on microscopy she has many bacteria with 6-10 squamous epithelium.  I cannot say that this is the cause of her symptoms.  She may be septic.  Given that she does have abdominal pain recommended ED evaluation for abdominal imaging that we cannot perform here.  Patient's husband will take her to [facility] emergency department by private vehicle.  Spoke with [facility] triage RN, Annice Pih who will await patient's arrival.  Discussed MDM, treatment plan and plan for follow-up with patient who agrees with plan.     Final Clinical Impressions(s) / UC Diagnoses   Final diagnoses:  Fever, unspecified  Influenza-like illness  Generalized abdominal pain     Discharge Instructions      You have been advised to follow up immediately in the emergency department for concerning signs or symptoms as discussed during your visit. If you declined EMS transport, please have a family member take you directly to the ED at this time. Do not delay.   Based on concerns about condition, if you do not follow up in the ED, you may risk poor outcomes including worsening of condition, delayed treatment and potentially life threatening issues. If you have declined to go to the ED at this time, you should call your PCP immediately to set up a follow up appointment.      ED Prescriptions    None    PDMP not reviewed this encounter.       Katha Cabal, DO 05/21/23 1155

## 2023-05-18 NOTE — ED Notes (Signed)
POC Preg Final Result: NEGATIVE 

## 2023-05-18 NOTE — ED Notes (Signed)
Patient is being discharged from the Urgent Care and sent to the Emergency Department via POV with family . Per Rachael Darby, MD, patient is in need of higher level of care due to abdominal pain and fever. Patient is aware and verbalizes understanding of plan of care.  Vitals:   05/18/23 1626  BP: 109/74  Pulse: (!) 107  Resp: 16  Temp: 99.4 F (37.4 C)  SpO2: 100%

## 2023-05-18 NOTE — ED Triage Notes (Signed)
Patient presents to UC for fever, HA, body aches, and vomiting since Monday. Treating with ibuprofen and tylenol.

## 2023-05-18 NOTE — ED Provider Notes (Signed)
[facility] Provider Note    Event Date/Time   First MD Initiated Contact with Patient 05/18/23 2032     (approximate)   History   Abdominal Pain and Fever   HPI  Christina Park is a 37 y.o. female presents the ER for evaluation of fever body aches progressive right lower quadrant pain since Monday.  Tmax of 105.  No recent travel.  Having some vomiting associated with this.  Denies any dysuria.  Has had some discomfort with bowel movements.     Physical Exam   Triage Vital Signs: ED Triage Vitals  Encounter Vitals Group     BP 05/18/23 1925 (!) 119/94     Systolic BP Percentile --      Diastolic BP Percentile --      Pulse Rate 05/18/23 1925 (!) 120     Resp 05/18/23 1925 20     Temp 05/18/23 1925 99.9 F (37.7 C)     Temp Source 05/18/23 1925 Oral     SpO2 05/18/23 1925 98 %     Weight 05/18/23 1926 210 lb (95.3 kg)     Height 05/18/23 1926 5\' 5"  (1.651 m)     Head Circumference --      Peak Flow --      Pain Score 05/18/23 1925 6     Pain Loc --      Pain Education --      Exclude from Growth Chart --     Most recent vital signs: Vitals:   05/18/23 1925  BP: (!) 119/94  Pulse: (!) 120  Resp: 20  Temp: 99.9 F (37.7 C)  SpO2: 98%     Constitutional: Alert  Eyes: Conjunctivae are normal.  Head: Atraumatic. Nose: No congestion/rhinnorhea. Mouth/Throat: Mucous membranes are moist.   Neck: Painless ROM.  Cardiovascular:   Good peripheral circulation. Respiratory: Normal respiratory effort.  No retractions.  Gastrointestinal: Soft mild tenderness to palpation the right lower quadrant.  No guarding or rebound. Musculoskeletal:  no deformity Neurologic:  MAE spontaneously. No gross focal neurologic deficits are appreciated.  Skin:  Skin is warm, dry and intact. No rash noted. Psychiatric: Mood and affect are normal. Speech and behavior are normal.    ED Results / Procedures / Treatments   Labs (all labs ordered are  listed, but only abnormal results are displayed) Labs Reviewed  CBC WITH DIFFERENTIAL/PLATELET - Abnormal; Notable for the following components:      Result Value   RBC 3.77 (*)    Hemoglobin 11.7 (*)    HCT 35.0 (*)    Neutro Abs 7.9 (*)    Monocytes Absolute 1.5 (*)    All other components within normal limits  COMPREHENSIVE METABOLIC PANEL - Abnormal; Notable for the following components:   Sodium 134 (*)    Glucose, Bld 109 (*)    Calcium 7.9 (*)    Albumin 3.3 (*)    AST 103 (*)    ALT 147 (*)    Anion gap 4 (*)    All other components within normal limits  URINALYSIS, ROUTINE W REFLEX MICROSCOPIC - Abnormal; Notable for the following components:   Color, Urine YELLOW (*)    APPearance CLEAR (*)    Specific Gravity, Urine >1.046 (*)    Hgb urine dipstick MODERATE (*)    Ketones, ur 5 (*)    All other components within normal limits  LACTIC ACID, PLASMA  LACTIC ACID, PLASMA  POC URINE PREG, ED  EKG     RADIOLOGY Please see ED Course for my review and interpretation.  I personally reviewed all radiographic images ordered to evaluate for the above acute complaints and reviewed radiology reports and findings.  These findings were personally discussed with the patient.  Please see medical record for radiology report.    PROCEDURES:  Critical Care performed: No  Procedures   MEDICATIONS ORDERED IN ED: Medications  ondansetron (ZOFRAN) injection 4 mg (has no administration in time range)  sodium chloride 0.9 % bolus 1,000 mL (has no administration in time range)  0.9 %  sodium chloride infusion (has no administration in time range)  sodium chloride 0.9 % bolus 1,000 mL (0 mLs Intravenous Stopped 05/18/23 2204)  iohexol (OMNIPAQUE) 350 MG/ML injection 100 mL (100 mLs Intravenous Contrast Given 05/18/23 2055)  cefTRIAXone (ROCEPHIN) 1 g in sodium chloride 0.9 % 100 mL IVPB (0 g Intravenous Stopped 05/18/23 2226)     IMPRESSION / MDM / ASSESSMENT AND PLAN / ED  COURSE  I reviewed the triage vital signs and the nursing notes.                              Differential diagnosis includes, but is not limited to, cystitis, colitis, UTI, stone, appendicitis  Patient presenting to the ER for evaluation of symptoms as described above.  Based on symptoms, risk factors and considered above differential, this presenting complaint could reflect a potentially life-threatening illness therefore the patient will be placed on continuous pulse oximetry and telemetry for monitoring.  Laboratory evaluation performed in urgent care with evidence of leukocytosis.  No clear UTI.  COVID-negative.  Was given Motrin prior to arrival.  Chest x-ray on my review and interpretation without evidence of consolidation.    Clinical Course as of 05/18/23 2345  Wed May 18, 2023  2222 Presentation is consistent with pyelonephritis.  Will give additional IV fluids.  Evaluate for markers of sepsis. [PR]  2344 Patient reassessed.  She appears ill she is tachycardic having rigors.  Does meet septic criteria secondary to pyelonephritis.  With will continue with IV hydration.  Will consult hospitalist for admission. [PR]    Clinical Course User Index [PR] Willy Eddy, MD     FINAL CLINICAL IMPRESSION(S) / ED DIAGNOSES   Final diagnoses:  Pyelonephritis     Rx / DC Orders   ED Discharge Orders     None        Note:  This document was prepared using Dragon voice recognition software and may include unintentional dictation errors.    Willy Eddy, MD 05/18/23 [phone]

## 2023-05-18 NOTE — ED Triage Notes (Signed)
Pt to ED from UC via POV c/o RLQ pain and fever since Monday. Pt went to UC today and had labs, xray, and resp swab done and was sent here. Pt took ibuprofen 1hr ago. Denies CP, SOB, dizziness

## 2023-05-18 NOTE — Discharge Instructions (Signed)

## 2023-05-19 ENCOUNTER — Other Ambulatory Visit: Payer: Self-pay

## 2023-05-19 ENCOUNTER — Encounter: Payer: Self-pay | Admitting: Internal Medicine

## 2023-05-19 DIAGNOSIS — N12 Tubulo-interstitial nephritis, not specified as acute or chronic: Principal | ICD-10-CM | POA: Diagnosis present

## 2023-05-19 DIAGNOSIS — R519 Headache, unspecified: Secondary | ICD-10-CM | POA: Insufficient documentation

## 2023-05-19 DIAGNOSIS — E871 Hypo-osmolality and hyponatremia: Secondary | ICD-10-CM | POA: Insufficient documentation

## 2023-05-19 DIAGNOSIS — N10 Acute tubulo-interstitial nephritis: Secondary | ICD-10-CM

## 2023-05-19 DIAGNOSIS — N390 Urinary tract infection, site not specified: Secondary | ICD-10-CM

## 2023-05-19 DIAGNOSIS — R109 Unspecified abdominal pain: Secondary | ICD-10-CM

## 2023-05-19 DIAGNOSIS — R7309 Other abnormal glucose: Secondary | ICD-10-CM

## 2023-05-19 DIAGNOSIS — E876 Hypokalemia: Secondary | ICD-10-CM | POA: Insufficient documentation

## 2023-05-19 DIAGNOSIS — A419 Sepsis, unspecified organism: Secondary | ICD-10-CM

## 2023-05-19 DIAGNOSIS — R7989 Other specified abnormal findings of blood chemistry: Secondary | ICD-10-CM

## 2023-05-19 DIAGNOSIS — D649 Anemia, unspecified: Secondary | ICD-10-CM

## 2023-05-19 LAB — CBC
HCT: 30.3 % — ABNORMAL LOW (ref 36.0–46.0)
HCT: 33.4 % — ABNORMAL LOW (ref 36.0–46.0)
Hemoglobin: 10.5 g/dL — ABNORMAL LOW (ref 12.0–15.0)
Hemoglobin: 11.2 g/dL — ABNORMAL LOW (ref 12.0–15.0)
MCH: 31.2 pg (ref 26.0–34.0)
MCH: 30.6 pg (ref 26.0–34.0)
MCHC: 34.7 g/dL (ref 30.0–36.0)
MCHC: 33.5 g/dL (ref 30.0–36.0)
MCV: 89.9 fL (ref 80.0–100.0)
MCV: 91.3 fL (ref 80.0–100.0)
Platelets: 201 10*3/uL (ref 150–400)
Platelets: 208 10*3/uL (ref 150–400)
RBC: 3.37 MIL/uL — ABNORMAL LOW (ref 3.87–5.11)
RBC: 3.66 MIL/uL — ABNORMAL LOW (ref 3.87–5.11)
RDW: 12.7 % (ref 11.5–15.5)
RDW: 12.6 % (ref 11.5–15.5)
WBC: 9.7 10*3/uL (ref 4.0–10.5)
WBC: 11 10*3/uL — ABNORMAL HIGH (ref 4.0–10.5)
nRBC: 0 % (ref 0.0–0.2)
nRBC: 0 % (ref 0.0–0.2)

## 2023-05-19 LAB — COMPREHENSIVE METABOLIC PANEL
ALT: 134 U/L — ABNORMAL HIGH (ref 0–44)
AST: 82 U/L — ABNORMAL HIGH (ref 15–41)
Albumin: 3.2 g/dL — ABNORMAL LOW (ref 3.5–5.0)
Alkaline Phosphatase: 87 U/L (ref 38–126)
Anion gap: 8 (ref 5–15)
BUN: 7 mg/dL (ref 6–20)
CO2: 20 mmol/L — ABNORMAL LOW (ref 22–32)
Calcium: 8 mg/dL — ABNORMAL LOW (ref 8.9–10.3)
Chloride: 105 mmol/L (ref 98–111)
Creatinine, Ser: 0.67 mg/dL (ref 0.44–1.00)
GFR, Estimated: >60 mL/min (ref >60)
Glucose, Bld: 107 mg/dL — ABNORMAL HIGH (ref 70–99)
Potassium: 3.2 mmol/L — ABNORMAL LOW (ref 3.5–5.1)
Sodium: 133 mmol/L — ABNORMAL LOW (ref 135–145)
Total Bilirubin: 1.2 mg/dL (ref 0.3–1.2)
Total Protein: 6.6 g/dL (ref 6.5–8.1)

## 2023-05-19 LAB — HEMOGLOBIN A1C
Hgb A1c MFr Bld: 5 % (ref 4.8–5.6)
Mean Plasma Glucose: 96.8 mg/dL

## 2023-05-19 LAB — HIV ANTIBODY (ROUTINE TESTING W REFLEX): HIV Screen 4th Generation wRfx: NONREACTIVE

## 2023-05-19 MED ORDER — SODIUM CHLORIDE 0.9 % IV SOLN
12.5000 mg | Freq: Once | INTRAVENOUS | Status: AC
  Administered 2023-05-19: 12.5 mg via INTRAVENOUS
  Filled 2023-05-19: qty 12.5

## 2023-05-19 MED ORDER — OXYCODONE HCL 5 MG PO TABS: 5.0000 mg | ORAL_TABLET | Freq: Four times a day (QID) | ORAL | Status: DC | PRN

## 2023-05-19 MED ORDER — POTASSIUM CHLORIDE CRYS ER 20 MEQ PO TBCR
40.0000 meq | EXTENDED_RELEASE_TABLET | Freq: Once | ORAL | Status: AC
  Administered 2023-05-19: 40 meq via ORAL
  Filled 2023-05-19: qty 2

## 2023-05-19 MED ORDER — IBUPROFEN 400 MG PO TABS
600.0000 mg | ORAL_TABLET | Freq: Three times a day (TID) | ORAL | Status: DC | PRN
  Administered 2023-05-19 – 2023-05-21 (×3): 600 mg via ORAL
  Filled 2023-05-19 (×3): qty 2

## 2023-05-19 MED ORDER — SODIUM CHLORIDE 0.9 % IV SOLN
12.5000 mg | Freq: Once | INTRAVENOUS | Status: AC
  Administered 2023-05-19: 12.5 mg via INTRAVENOUS
  Filled 2023-05-19: qty 0.5

## 2023-05-19 MED ORDER — ACETAMINOPHEN 325 MG PO TABS
650.0000 mg | ORAL_TABLET | Freq: Three times a day (TID) | ORAL | Status: AC | PRN
  Administered 2023-05-19 – 2023-05-20 (×3): 650 mg via ORAL
  Filled 2023-05-19 (×3): qty 2

## 2023-05-19 MED ORDER — LACTATED RINGERS IV SOLN
INTRAVENOUS | Status: DC
  Administered 2023-05-19: 1000 mL via INTRAVENOUS

## 2023-05-19 MED ORDER — BUTALBITAL-APAP-CAFFEINE 50-325-40 MG PO TABS
1.0000 | ORAL_TABLET | Freq: Four times a day (QID) | ORAL | Status: DC | PRN
  Administered 2023-05-19 – 2023-05-20 (×4): 1 via ORAL
  Filled 2023-05-19 (×4): qty 1

## 2023-05-19 MED ORDER — PANTOPRAZOLE SODIUM 40 MG IV SOLR
40.0000 mg | Freq: Two times a day (BID) | INTRAVENOUS | Status: DC
  Administered 2023-05-19 – 2023-05-20 (×3): 40 mg via INTRAVENOUS
  Filled 2023-05-19 (×3): qty 10

## 2023-05-19 MED ORDER — MAGNESIUM SULFATE 2 GM/50ML IV SOLN
2.0000 g | Freq: Once | INTRAVENOUS | Status: AC
  Administered 2023-05-19: 2 g via INTRAVENOUS
  Filled 2023-05-19: qty 50

## 2023-05-19 MED ORDER — ONDANSETRON HCL 4 MG PO TABS: 4.0000 mg | ORAL_TABLET | Freq: Four times a day (QID) | ORAL | Status: DC | PRN

## 2023-05-19 MED ORDER — SODIUM CHLORIDE 0.9 % IV SOLN
2.0000 g | INTRAVENOUS | Status: DC
  Administered 2023-05-19 – 2023-05-21 (×3): 2 g via INTRAVENOUS
  Filled 2023-05-19 (×3): qty 20

## 2023-05-19 MED ORDER — ACETAMINOPHEN 650 MG RE SUPP: 650.0000 mg | Freq: Four times a day (QID) | RECTAL | Status: AC | PRN

## 2023-05-19 MED ORDER — ONDANSETRON HCL 4 MG/2ML IJ SOLN: 4.0000 mg | Freq: Four times a day (QID) | INTRAMUSCULAR | Status: DC | PRN

## 2023-05-19 MED ORDER — ENOXAPARIN SODIUM 60 MG/0.6ML IJ SOSY
0.5000 mg/kg | PREFILLED_SYRINGE | INTRAMUSCULAR | Status: DC
  Administered 2023-05-19 – 2023-05-21 (×3): 47.5 mg via SUBCUTANEOUS
  Filled 2023-05-19 (×3): qty 0.6

## 2023-05-19 MED ORDER — HEPARIN SODIUM (PORCINE) 5000 UNIT/ML IJ SOLN
5000.0000 [IU] | Freq: Three times a day (TID) | INTRAMUSCULAR | Status: DC
  Administered 2023-05-19: 5000 [IU] via SUBCUTANEOUS
  Filled 2023-05-19: qty 1

## 2023-05-19 NOTE — Assessment & Plan Note (Addendum)
CT scan consistent with pyelonephritis.  Urine analysis was actually not positive.  He had on urine culture growing 60,000 E. coli.  Await sensitivities.  Continue to monitor temperature curve.  Continue IV Rocephin.

## 2023-05-19 NOTE — Assessment & Plan Note (Addendum)
Secondary to sepsis. °

## 2023-05-19 NOTE — Assessment & Plan Note (Addendum)
Present on admission with fever, leukocytosis and tachycardia.  E. coli growing out of urine culture. Continue rocephin.

## 2023-05-19 NOTE — Assessment & Plan Note (Addendum)
Hemoglobin A1c 5.0.  Patient not a diabetic.

## 2023-05-19 NOTE — Progress Notes (Signed)
  Progress Note   Patient: Christina Park [id number]:[id number] DOB: 1986-01-23 DOA: 05/18/2023     0 DOS: the patient was seen and examined on 05/19/2023   Brief hospital course: 37 year old female presenting with fever and chills and some nausea vomiting and headache.  She is having lower abdominal pain on the right side.  CT scan consistent with pyelonephritis.    Assessment and Plan: * Acute pyelonephritis CT scan consistent with pyelonephritis.  Urine analysis was actually not positive.  Spoke with pharmacist add on urine culture.  Continue to monitor temperature curve.   Sepsis secondary to UTI [facility]) Present on admission with fever, leukocytosis and tachycardia.  Continue IV fluids and IV Rocephin.   Acute right flank pain Secondary to pyelonephritis   Headache IV magnesium and Phenergan given.  As needed Fioricet.  Hypokalemia Replace potassium  Hyponatremia IV fluid hydration  Elevated glucose level Hemoglobin A1c 5.0.  Patient not a diabetic.  Anemia Last hemoglobin 11.2      Abnormal LFTs Secondary to sepsis        Subjective: Patient having a headache currently.  Came in with shaking and fever and lower abdominal pain.  Found to have pyelonephritis on CT scan.  Patient has a dry mouth.  Not much burning on urination.  No vaginal discharge.  Physical Exam: Vitals:   05/19/23 0434 05/19/23 0723 05/19/23 1020 05/19/23 1533  BP:  104/65  107/71  Pulse:  (!) 108  94  Resp:  17  16  Temp:  100.1 F (37.8 C) 100.3 F (37.9 C) 98.2 F (36.8 C)  TempSrc:   Oral Oral  SpO2:  100%  100%  Weight: 95.3 kg     Height: 5\' 5"  (1.651 m)      Physical Exam HENT:     Head: Normocephalic.     Mouth/Throat:     Pharynx: No oropharyngeal exudate.  Eyes:     General: Lids are normal.     Conjunctiva/sclera: Conjunctivae normal.  Cardiovascular:     Rate and Rhythm: Normal rate and regular rhythm.     Heart sounds: Normal heart sounds, S1 normal and S2 normal.   Pulmonary:     Breath sounds: No decreased breath sounds, wheezing, rhonchi or rales.  Abdominal:     Palpations: Abdomen is soft.     Tenderness: There is abdominal tenderness in the right lower quadrant.  Musculoskeletal:     Right lower leg: No swelling.     Left lower leg: No swelling.  Skin:    General: Skin is warm.     Findings: No rash.  Neurological:     Mental Status: She is alert and oriented to person, place, and time.     Data Reviewed: CT scan of the abdomen pelvis consistent with right-sided pyelonephritis Sodium 133, potassium 3.2, CO2 20, AST 82, ALT 134, white blood cell count 11.0, hemoglobin 11.2, platelet count 208, urinalysis shows moderate blood Family Communication: Permission to speak in front of visitor at bedside.  Disposition: Status is: Observation Patient just admitted after midnight having low-grade fever and chills.  Awaiting cultures.  Continue IV Rocephin.  Planned Discharge Destination: Home    Time spent: 28 minutes  Author: Alford Highland, MD 05/19/2023 3:47 PM  For on call review [email or web address].

## 2023-05-19 NOTE — H&P (Signed)
History and Physical    Patient: Christina Park NAT:[id number] DOB: [birth year] DOA: 05/18/2023 DOS: the patient was seen and examined on 05/19/2023 PCP: Patient, No Pcp Per  Patient coming from: Home   Chief Complaint:  Chief Complaint  Patient presents with   Abdominal Pain   Fever    HPI: Christina Park is a 37 y.o. female with no significant past medical history  or any prescription meds presenting with Rt flank pain since Monday.  Pt is ill appearing , dehydrated also complains of hurting everywhere fevers chills vomiting nausea and send taking ibuprofen and Tylenol.  Patient was seen in urgent care and was sent to the hospital for abdominal pain fever.  Urine pregnancy test done in the ED was negative.  No reports of any diarrhea or rashes.  Patient uses an IUD.  In the emergency room meets sepsis criteria with vitals UTI. Patient otherwise does not report chest pain shortness of breath palpitations or any other complaints.  Patient noted to be tachycardic on arrival with heart rates in the 120s and temperature of 99.9. Blood work shows sodium of 134 glucose 109 calcium 7.9 LFTs with AST of 103 and ALT of 147.  CBC shows normal white count of 10.5 hemoglobin that has decreased from 13.2-11.7 which I suspect is from hemodilution.  Urinalysis today shows 0-5 WBCs, moderate hemoglobin, negative nitrite negative leukocytes yellow urine. CT of the abdomen and pelvis done shows right-sided pyelonephritis, no hydronephrosis.  Review of Systems: Review of Systems  Constitutional:  Positive for chills, fever and malaise/fatigue.  Gastrointestinal:  Positive for abdominal pain, nausea and vomiting.  Genitourinary:  Positive for flank pain.  Musculoskeletal:  Positive for myalgias.  All other systems reviewed and are negative.    No past medical history on file. Past Surgical History:  Procedure Laterality Date   APPENDECTOMY     CHOLECYSTECTOMY     GASTRIC BYPASS     TONSILLECTOMY      Social History:  reports that she has never smoked. She has never used smokeless tobacco. She reports that she does not currently use alcohol. She reports that she does not use drugs.  No Known Allergies  Family History  Problem Relation Age of Onset   Healthy Mother    Healthy Father     Prior to Admission medications   Medication Sig Start Date End Date Taking? Authorizing Provider  ibuprofen (ADVIL) 800 MG tablet Take 1 tablet (800 mg total) by mouth 3 (three) times daily. 05/10/20   Rodriguez-Southworth, Nettie Elm, PA-C  levonorgestrel (MIRENA) 20 MCG/24HR IUD 1 each by Intrauterine route once.    [provider]  Multiple Vitamin (MULTIVITAMIN) capsule Take 1 capsule by mouth daily.    [provider]     Vitals:   05/18/23 2230 05/18/23 2300 05/18/23 2330 05/19/23 0000  BP: 116/80 109/77 118/80 129/88  Pulse: 92 89 (!) 111 (!) 127  Resp:    20  Temp: 98.1 F (36.7 C)   98.6 F (37 C)  TempSrc:      SpO2: 100% 100% 100% 100%  Weight:      Height:       Physical Exam Vitals and nursing note reviewed.  Constitutional:      General: She is not in acute distress. HENT:     Head: Normocephalic and atraumatic.     Right Ear: Hearing normal.     Left Ear: Hearing normal.     Nose: Nose normal. No nasal deformity.  Mouth/Throat:     Lips: Pink.     Tongue: No lesions.     Pharynx: Oropharynx is clear.  Eyes:     General: Lids are normal.     Extraocular Movements: Extraocular movements intact.  Cardiovascular:     Rate and Rhythm: Normal rate and regular rhythm.     Heart sounds: Normal heart sounds.  Pulmonary:     Effort: Pulmonary effort is normal.     Breath sounds: Normal breath sounds.  Abdominal:     General: Bowel sounds are normal. There is no distension.     Palpations: Abdomen is soft. There is no mass.     Tenderness: There is right CVA tenderness.    Musculoskeletal:     Right lower leg: No edema.     Left lower leg: No  edema.  Skin:    General: Skin is warm.  Neurological:     General: No focal deficit present.     Mental Status: She is alert and oriented to person, place, and time.     Cranial Nerves: Cranial nerves 2-12 are intact.  Psychiatric:        Attention and Perception: Attention normal.        Mood and Affect: Mood normal.        Speech: Speech normal.        Behavior: Behavior normal. Behavior is cooperative.     Labs on Admission: I have personally reviewed following labs and imaging studies  CBC: Recent Labs  Lab 05/18/23 1742 05/18/23 2155  WBC 11.1* 10.5  NEUTROABS 8.3* 7.9*  HGB 13.2 11.7*  HCT 38.5 35.0*  MCV 90.8 92.8  PLT 250 228   Basic Metabolic Panel: Recent Labs  Lab 05/18/23 1742 05/18/23 2155  NA 133* 134*  K 3.9 3.5  CL 99 106  CO2 23 24  GLUCOSE 116* 109*  BUN 8 10  CREATININE 0.88 0.78  CALCIUM 8.9 7.9*   GFR: Estimated Creatinine Clearance: 109.9 mL/min (by C-G formula based on SCr of 0.78 mg/dL). Liver Function Tests: Recent Labs  Lab 05/18/23 2155  AST 103*  ALT 147*  ALKPHOS 89  BILITOT 1.0  PROT 7.1  ALBUMIN 3.3*   No results for input(s): "LIPASE", "AMYLASE" in the last 168 hours. No results for input(s): "AMMONIA" in the last 168 hours. Coagulation Profile: No results for input(s): "INR", "PROTIME" in the last 168 hours. Cardiac Enzymes: No results for input(s): "CKTOTAL", "CKMB", "CKMBINDEX", "TROPONINI" in the last 168 hours. BNP (last 3 results) No results for input(s): "PROBNP" in the last 8760 hours. HbA1C: No results for input(s): "HGBA1C" in the last 72 hours. CBG: No results for input(s): "GLUCAP" in the last 168 hours. Lipid Profile: No results for input(s): "CHOL", "HDL", "LDLCALC", "TRIG", "CHOLHDL", "LDLDIRECT" in the last 72 hours. Thyroid Function Tests: No results for input(s): "TSH", "T4TOTAL", "FREET4", "T3FREE", "THYROIDAB" in the last 72 hours. Anemia Panel: No results for input(s): "VITAMINB12", "FOLATE",  "FERRITIN", "TIBC", "IRON", "RETICCTPCT" in the last 72 hours. Urine analysis: Urinalysis    Component Value Date/Time   COLORURINE YELLOW (A) 05/18/2023 2206   APPEARANCEUR CLEAR (A) 05/18/2023 2206   LABSPEC >1.046 (H) 05/18/2023 2206   PHURINE 6.0 05/18/2023 2206   GLUCOSEU NEGATIVE 05/18/2023 2206   HGBUR MODERATE (A) 05/18/2023 2206   BILIRUBINUR NEGATIVE 05/18/2023 2206   KETONESUR 5 (A) 05/18/2023 2206   PROTEINUR NEGATIVE 05/18/2023 2206   NITRITE NEGATIVE 05/18/2023 2206   LEUKOCYTESUR NEGATIVE 05/18/2023 2206  Unresulted Labs (From admission, onward)     Start     Ordered   05/19/23 0500  CBC  Tomorrow morning,   R        05/19/23 0122   05/19/23 0500  Comprehensive metabolic panel  Tomorrow morning,   R        05/19/23 0122   05/19/23 0123  Hemoglobin A1c  Add-on,   AD        05/19/23 0122   05/19/23 0109  HIV Antibody (routine testing w rflx)  (HIV Antibody (Routine testing w reflex) panel)  Once,   R        05/19/23 0122   05/19/23 0109  CBC  (heparin)  Once,   R       Comments: Baseline for heparin therapy IF NOT ALREADY DRAWN.  Notify MD if PLT < 100 K.    05/19/23 0122   05/18/23 2346  Blood culture (routine x 2)  BLOOD CULTURE X 2,   STAT      05/18/23 2345           Radiological Exams on Admission: CT ABDOMEN PELVIS W CONTRAST  Result Date: 05/18/2023 CLINICAL DATA:  Right lower quadrant pain EXAM: CT ABDOMEN AND PELVIS WITH CONTRAST TECHNIQUE: Multidetector CT imaging of the abdomen and pelvis was performed using the standard protocol following bolus administration of intravenous contrast. RADIATION DOSE REDUCTION: This exam was performed according to the departmental dose-optimization program which includes automated exposure control, adjustment of the mA and/or kV according to patient size and/or use of iterative reconstruction technique. CONTRAST:  OMNIPAQUE IOHEXOL 350 MG/ML SOLN COMPARISON:  None Available. FINDINGS: Lower chest: No acute  abnormality.  Bilateral breast implants are. Hepatobiliary: No focal liver abnormality is seen. Status post cholecystectomy. No biliary dilatation. Pancreas: Unremarkable. No pancreatic ductal dilatation or surrounding inflammatory changes. Spleen: Normal in size without focal abnormality. Adrenals/Urinary Tract: There are patchy areas of hypoattenuation throughout the right kidney. There is mild perinephric fat stranding. There is no hydronephrosis. There is some wall enhancement of the proximal ureter and renal pelvis. No perinephric fluid collection. The adrenal glands, left kidney and bladder are within normal limits. Stomach/Bowel: There are postsurgical changes in the stomach. Appendix appears normal. No evidence of bowel wall thickening, distention, or inflammatory changes. Vascular/Lymphatic: No significant vascular findings are present. No enlarged abdominal or pelvic lymph nodes. Reproductive: There is an IUD in the uterus. Adnexa are within normal limits. Other: There is a small fat containing umbilical hernia. There is no free fluid. Musculoskeletal: No fracture is seen. IMPRESSION: Findings compatible with right-sided pyelonephritis. No hydronephrosis or perinephric fluid collection. Electronically Signed   By: Darliss Cheney M.D.   On: 05/18/2023 22:09   DG Chest 2 View  Result Date: 05/18/2023 CLINICAL DATA:  Fever, headache, cough.  Vomiting. EXAM: CHEST - 2 VIEW COMPARISON:  None Available. FINDINGS: Heart size is normal. Mediastinal shadows are normal. The lungs are clear. No bronchial thickening. No infiltrate, mass, effusion or collapse. Pulmonary vascularity is normal. No bony abnormality. IMPRESSION: Normal chest radiography. Electronically Signed   By: Paulina Fusi M.D.   On: 05/18/2023 17:42     Data Reviewed: Relevant notes from primary care and specialist visits, past discharge summaries as available in EHR, including Care Everywhere. Prior diagnostic testing as pertinent to current  admission diagnoses Updated medications and problem lists for reconciliation ED course, including vitals, labs, imaging, treatment and response to treatment Triage notes, nursing and pharmacy notes and  ED provider's notes Notable results as noted in HPI Assessment and Plan: * Acute pyelonephritis Rocephin continued started in ED.   Acute right flank pain 2/2 pyelonephritis . Since past few days. PRN tylenol q 12.    Sepsis secondary to UTI (HCC) Pt meets sepsis criteria with UTI ,vitals and WBC count. Lactic acid normal.  Pt with fevers/ chills and pyelonephritis.  We will follow urine culture and continue rocephin . MIVF LR at 100 cc/ hour.    Elevated glucose level Elevated Glucose with her BMP of 34.95 will get A1c suspect pt may have diabetes putting her at risk for UTI.    Anemia Mild anemia with hb of 11.7 due to rehydration and hemodilution, with sepsis based repletion . Per PCP to follow and evaluate if unresolved or persistent.  Pt received : total of 2 L  NS.       Abnormal LFTs    Latest Ref Rng & Units 05/18/2023    9:55 PM  Hepatic Function  Total Protein 6.5 - 8.1 g/dL 7.1   Albumin 3.5 - 5.0 g/dL 3.3   AST 15 - 41 U/L 829   ALT 0 - 44 U/L 147   Alk Phosphatase 38 - 126 U/L 89   Total Bilirubin 0.3 - 1.2 mg/dL 1.0   2/2 to sepsis. Cont with IVF rehydration antibiotic. Note pt is s/p cholecystectomy and no biliary dilation on ct.    DVT prophylaxis:  Heparin   Consults:  None   Advance Care Planning:    Code Status: Full Code Family Communication:  None  Disposition Plan:  Back to previous home environment  Severity of Illness: The appropriate patient status for this patient is OBSERVATION. Observation status is judged to be reasonable and necessary in order to provide the required intensity of service to ensure the patient's safety. The patient's presenting symptoms, physical exam findings, and initial radiographic and laboratory data in  the context of their medical condition is felt to place them at decreased risk for further clinical deterioration. Furthermore, it is anticipated that the patient will be medically stable for discharge from the hospital within 2 midnights of admission.   Author: Gertha Calkin, MD 05/19/2023 1:29 AM  For on call review [email or web address].

## 2023-05-19 NOTE — Assessment & Plan Note (Addendum)
Improved today.  As needed Fioricet.

## 2023-05-19 NOTE — Assessment & Plan Note (Addendum)
Replaced. °

## 2023-05-19 NOTE — Discharge Instructions (Signed)
Some PCP options in [place] area- not a comprehensive list  [facility]- [phone] [place]- [phone] [facility]- [phone] [facility]- [phone] [facility]- [phone] [place]- [phone]  or [place] [facility] [phone]  

## 2023-05-19 NOTE — Assessment & Plan Note (Addendum)
Last hemoglobin 11.2

## 2023-05-19 NOTE — Plan of Care (Signed)
  Problem: Clinical Measurements: Goal: Diagnostic test results will improve Outcome: Progressing Goal: Signs and symptoms of infection will decrease Outcome: Progressing   Problem: Education: Goal: Knowledge of General Education information will improve Description: Including pain rating scale, medication(s)/side effects and non-pharmacologic comfort measures Outcome: Progressing   Problem: Clinical Measurements: Goal: Diagnostic test results will improve Outcome: Progressing   Problem: Activity: Goal: Risk for activity intolerance will decrease Outcome: Progressing   Problem: Nutrition: Goal: Adequate nutrition will be maintained Outcome: Progressing   Problem: Coping: Goal: Level of anxiety will decrease Outcome: Progressing   Problem: Pain Managment: Goal: General experience of comfort will improve Outcome: Progressing

## 2023-05-19 NOTE — Assessment & Plan Note (Addendum)
normalized

## 2023-05-19 NOTE — Assessment & Plan Note (Addendum)
Secondary to pyelonephritis

## 2023-05-19 NOTE — ED Notes (Signed)
Unsuccessful PIV attempt x 2, awaiting secondary rn

## 2023-05-19 NOTE — Hospital Course (Addendum)
37 year old female presenting with fever and chills and some nausea vomiting and headache.  She is having lower abdominal pain on the right side.  CT scan consistent with pyelonephritis.  8/2.  Patient feeling better today.  Had a fever of 101 last night.  8/3.  Patient had temperature of 100.5 early this am.  Continue iv antibiotics today. 8/4.  Patient feeling better.  No further fever.  Switch over to oral Keflex for completion of course.

## 2023-05-19 NOTE — Consult Note (Signed)
PHARMACIST - PHYSICIAN COMMUNICATION  CONCERNING:  Enoxaparin (Lovenox) for DVT Prophylaxis    RECOMMENDATION: Patient was prescribed enoxaprin 40mg  q24 hours for VTE prophylaxis.   Filed Weights   05/18/23 1926 05/19/23 0434  Weight: 95.3 kg (210 lb) 95.3 kg (210 lb)    Body mass index is 34.95 kg/m.  Estimated Creatinine Clearance: 109.9 mL/min (by C-G formula based on SCr of 0.67 mg/dL).   Based on [facility] policy patient is candidate for enoxaparin 0.5mg /kg TBW SQ every 24 hours based on BMI being >30.  DESCRIPTION: Pharmacy has adjusted enoxaparin dose per [facility] policy.  Patient is now receiving enoxaparin 47.5 mg every 24 hours    Effie Shy, PharmD Clinical Pharmacist  05/19/2023 11:33 AM

## 2023-05-19 NOTE — Plan of Care (Signed)

## 2023-05-20 MED ORDER — KETOROLAC TROMETHAMINE 15 MG/ML IJ SOLN
15.0000 mg | Freq: Once | INTRAMUSCULAR | Status: AC
  Administered 2023-05-20: 15 mg via INTRAVENOUS
  Filled 2023-05-20: qty 1

## 2023-05-20 MED ORDER — SODIUM CHLORIDE 0.9 % IV SOLN
12.5000 mg | Freq: Once | INTRAVENOUS | Status: AC
  Administered 2023-05-20: 12.5 mg via INTRAVENOUS
  Filled 2023-05-20: qty 12.5

## 2023-05-20 MED ORDER — PANTOPRAZOLE SODIUM 40 MG PO TBEC
40.0000 mg | DELAYED_RELEASE_TABLET | Freq: Two times a day (BID) | ORAL | Status: DC
  Administered 2023-05-20 – 2023-05-22 (×4): 40 mg via ORAL
  Filled 2023-05-20 (×4): qty 1

## 2023-05-20 MED ORDER — MAGNESIUM SULFATE 2 GM/50ML IV SOLN
2.0000 g | Freq: Once | INTRAVENOUS | Status: AC
  Administered 2023-05-20: 2 g via INTRAVENOUS
  Filled 2023-05-20: qty 50

## 2023-05-20 NOTE — Plan of Care (Signed)
  Problem: Fluid Volume: Goal: Hemodynamic stability will improve Outcome: Progressing   Problem: Clinical Measurements: Goal: Diagnostic test results will improve Outcome: Progressing Goal: Signs and symptoms of infection will decrease Outcome: Progressing   Problem: Respiratory: Goal: Ability to maintain adequate ventilation will improve Outcome: Progressing   Problem: Education: Goal: Knowledge of General Education information will improve Description: Including pain rating scale, medication(s)/side effects and non-pharmacologic comfort measures Outcome: Progressing   Problem: Clinical Measurements: Goal: Ability to maintain clinical measurements within normal limits will improve Outcome: Progressing Goal: Will remain free from infection Outcome: Progressing Goal: Diagnostic test results will improve Outcome: Progressing Goal: Respiratory complications will improve Outcome: Progressing Goal: Cardiovascular complication will be avoided Outcome: Progressing   Problem: Activity: Goal: Risk for activity intolerance will decrease Outcome: Progressing   Problem: Nutrition: Goal: Adequate nutrition will be maintained Outcome: Progressing   Problem: Coping: Goal: Level of anxiety will decrease Outcome: Progressing   Problem: Elimination: Goal: Will not experience complications related to bowel motility Outcome: Progressing Goal: Will not experience complications related to urinary retention Outcome: Progressing   Problem: Pain Managment: Goal: General experience of comfort will improve Outcome: Progressing   Problem: Safety: Goal: Ability to remain free from injury will improve Outcome: Progressing   Problem: Skin Integrity: Goal: Risk for impaired skin integrity will decrease Outcome: Progressing

## 2023-05-20 NOTE — Progress Notes (Signed)
PHARMACIST - PHYSICIAN COMMUNICATION  DR:   Alford Highland   CONCERNING: IV to Oral Route Change Policy  RECOMMENDATION: This patient is receiving Pantoprazole 40 mg BID by the intravenous route.  Based on criteria approved by the Pharmacy and Therapeutics Committee, the intravenous medication(s) is/are being converted to the equivalent oral dose form(s).   DESCRIPTION: These criteria include: The patient is eating (either orally or via tube) and/or has been taking other orally administered medications for a least 24 hours The patient has no evidence of active gastrointestinal bleeding or impaired GI absorption (gastrectomy, short bowel, patient on TNA or NPO).  If you have questions about this conversion, please contact the Pharmacy Department  []   [phone] )  [facility] [x]   [phone] )  [facility] [place] []   [phone] )  [facility] []   [phone] )  [facility] []   [phone] )  [facility]   Effie Shy, [facility] 05/20/2023 11:05 AM

## 2023-05-20 NOTE — Progress Notes (Signed)
  Progress Note   Patient: Christina Park WUJ:[id number] DOB: 19-Jul-1986 DOA: 05/18/2023     1 DOS: the patient was seen and examined on 05/20/2023   Brief hospital course: 37 year old female presenting with fever and chills and some nausea vomiting and headache.  She is having lower abdominal pain on the right side.  CT scan consistent with pyelonephritis.  8/2.  Patient feeling better today.  Had a fever of 101 last night.     Assessment and Plan: * Acute pyelonephritis CT scan consistent with pyelonephritis.  Urine analysis was actually not positive.  He had on urine culture growing 60,000 E. coli.  Await sensitivities.  Continue to monitor temperature curve.  Continue IV Rocephin.   Sepsis secondary to UTI [facility]) Present on admission with fever, leukocytosis and tachycardia.  E. coli growing out of urine culture.  Await sensitivities.  Continue IV vancomycin.   Acute right flank pain Secondary to pyelonephritis   Headache Improved today.  As needed Fioricet.  Hypokalemia Replaced  Hyponatremia Check sodium again tomorrow  Elevated glucose level Hemoglobin A1c 5.0.  Patient not a diabetic.  Anemia Last hemoglobin 11.2      Abnormal LFTs Secondary to sepsis        Subjective: Patient feeling better today.  Had a lot of sweating today.  Headache is less than yesterday.  Still has some pain in her right flank  Physical Exam: Vitals:   05/19/23 2354 05/20/23 0457 05/20/23 0915 05/20/23 1559  BP: 91/61 (!) 96/57 103/70 129/76  Pulse: 83 94 (!) 101 90  Resp: 20 18    Temp: 98.2 F (36.8 C) 98.1 F (36.7 C) 98.1 F (36.7 C) 100.2 F (37.9 C)  TempSrc: Oral Oral    SpO2: 97% 98% 100% 100%  Weight:      Height:       Physical Exam HENT:     Head: Normocephalic.     Mouth/Throat:     Pharynx: No oropharyngeal exudate.  Eyes:     General: Lids are normal.     Conjunctiva/sclera: Conjunctivae normal.  Cardiovascular:     Rate and Rhythm: Normal rate  and regular rhythm.     Heart sounds: Normal heart sounds, S1 normal and S2 normal.  Pulmonary:     Breath sounds: No decreased breath sounds, wheezing, rhonchi or rales.  Abdominal:     Palpations: Abdomen is soft.     Tenderness: There is abdominal tenderness in the right lower quadrant.  Musculoskeletal:     Right lower leg: No swelling.     Left lower leg: No swelling.  Skin:    General: Skin is warm.     Findings: No rash.  Neurological:     Mental Status: She is alert and oriented to person, place, and time.     Data Reviewed: Sodium 133, potassium 3.2, CO2 20, AST 82, ALT 134, hemoglobin 11.2, white blood cell count 11.0, platelet count 208  Family Communication: Spoke with family at the bedside  Disposition: Status is: Inpatient Remains inpatient appropriate because: Had fever of 101 last night continue IV antibiotics today  Planned Discharge Destination: Home    Time spent: 28 minutes  Author: Alford Highland, MD 05/20/2023 4:46 PM  For on call review [email or web address].

## 2023-05-21 NOTE — Plan of Care (Signed)
  Problem: Fluid Volume: Goal: Hemodynamic stability will improve Outcome: Progressing   Problem: Clinical Measurements: Goal: Diagnostic test results will improve Outcome: Progressing Goal: Signs and symptoms of infection will decrease Outcome: Progressing   Problem: Respiratory: Goal: Ability to maintain adequate ventilation will improve Outcome: Progressing   Problem: Education: Goal: Knowledge of General Education information will improve Description: Including pain rating scale, medication(s)/side effects and non-pharmacologic comfort measures Outcome: Progressing   Problem: Clinical Measurements: Goal: Ability to maintain clinical measurements within normal limits will improve Outcome: Progressing Goal: Will remain free from infection Outcome: Progressing Goal: Diagnostic test results will improve Outcome: Progressing Goal: Respiratory complications will improve Outcome: Progressing Goal: Cardiovascular complication will be avoided Outcome: Progressing   Problem: Activity: Goal: Risk for activity intolerance will decrease Outcome: Progressing   Problem: Nutrition: Goal: Adequate nutrition will be maintained Outcome: Progressing   Problem: Coping: Goal: Level of anxiety will decrease Outcome: Progressing   Problem: Elimination: Goal: Will not experience complications related to bowel motility Outcome: Progressing Goal: Will not experience complications related to urinary retention Outcome: Progressing   Problem: Pain Managment: Goal: General experience of comfort will improve Outcome: Progressing   Problem: Safety: Goal: Ability to remain free from injury will improve Outcome: Progressing

## 2023-05-21 NOTE — Progress Notes (Signed)
  Progress Note   Patient: Christina Park JOA:[id number] DOB: [birth year] DOA: 05/18/2023     2 DOS: the patient was seen and examined on 05/21/2023   Brief hospital course: 37 year old female presenting with fever and chills and some nausea vomiting and headache.  She is having lower abdominal pain on the right side.  CT scan consistent with pyelonephritis.  8/2.  Patient feeling better today.  Had a fever of 101 last night.  8/3.  Patient had temperature of 100.5 early this am.  Continue iv antibiotics today.    Assessment and Plan: * Acute pyelonephritis CT scan consistent with pyelonephritis.  Urine analysis was actually not positive.  He had on urine culture growing 60,000 E. Coli. Continue to monitor temperature curve.  Continue IV Rocephin today with temperature of 100.5 this am.   Sepsis secondary to UTI [facility]) Present on admission with fever, leukocytosis and tachycardia.  E. coli growing out of urine culture. Continue rocephin.   Acute right flank pain Secondary to pyelonephritis   Headache Headache happens when she has the fever.  As needed Fioricet.  Hypokalemia Replaced  Hyponatremia normalized  Elevated glucose level Hemoglobin A1c 5.0.  Patient not a diabetic.  Anemia Last hemoglobin 11.2      Abnormal LFTs Secondary to sepsis        Subjective: Patient feeling a little bit better today.  Has headache when she has the fever.  Admitted with pyelonephritis.  Physical Exam: Vitals:   05/20/23 2108 05/21/23 0240 05/21/23 0445 05/21/23 0906  BP: 105/65  110/63 115/78  Pulse: 83  79 64  Resp: 20  19 14   Temp: 98.6 F (37 C) (!) 100.5 F (38.1 C) 98.9 F (37.2 C) 97.8 F (36.6 C)  TempSrc:  Oral Oral Oral  SpO2: 100%  99% 100%  Weight:      Height:       Physical Exam HENT:     Head: Normocephalic.     Mouth/Throat:     Pharynx: No oropharyngeal exudate.  Eyes:     General: Lids are normal.     Conjunctiva/sclera: Conjunctivae normal.   Cardiovascular:     Rate and Rhythm: Normal rate and regular rhythm.     Heart sounds: Normal heart sounds, S1 normal and S2 normal.  Pulmonary:     Breath sounds: No decreased breath sounds, wheezing, rhonchi or rales.  Abdominal:     Palpations: Abdomen is soft.     Tenderness: There is no abdominal tenderness.  Musculoskeletal:     Right lower leg: No swelling.     Left lower leg: No swelling.  Skin:    General: Skin is warm.     Findings: No rash.  Neurological:     Mental Status: She is alert and oriented to person, place, and time.     Data Reviewed: E coli sensitive to rocephin  Family Communication: Family at bedside  Disposition: Status is: Inpatient Remains inpatient appropriate because: Had temp this am of 100.5.  Continue iv abx today and reassess tomorrow for discharge.  Planned Discharge Destination: Home    Time spent: 28 minutes  Author: Alford Highland, MD 05/21/2023 10:53 AM  For on call review [email or web address].

## 2023-05-21 NOTE — Plan of Care (Signed)

## 2023-05-22 LAB — CBC WITH DIFFERENTIAL/PLATELET
Abs Immature Granulocytes: 0.03 10*3/uL (ref 0.00–0.07)
Basophils Absolute: 0 10*3/uL (ref 0.0–0.1)
Basophils Relative: 0 %
Eosinophils Absolute: 0.1 10*3/uL (ref 0.0–0.5)
Eosinophils Relative: 2 %
HCT: 32.9 % — ABNORMAL LOW (ref 36.0–46.0)
Hemoglobin: 11.1 g/dL — ABNORMAL LOW (ref 12.0–15.0)
Immature Granulocytes: 1 %
Lymphocytes Relative: 47 %
Lymphs Abs: 2.7 10*3/uL (ref 0.7–4.0)
MCH: 30.4 pg (ref 26.0–34.0)
MCHC: 33.7 g/dL (ref 30.0–36.0)
MCV: 90.1 fL (ref 80.0–100.0)
Monocytes Absolute: 0.8 10*3/uL (ref 0.1–1.0)
Monocytes Relative: 13 %
Neutro Abs: 2.1 10*3/uL (ref 1.7–7.7)
Neutrophils Relative %: 37 %
Platelets: 267 10*3/uL (ref 150–400)
RBC: 3.65 MIL/uL — ABNORMAL LOW (ref 3.87–5.11)
RDW: 12.6 % (ref 11.5–15.5)
WBC: 5.7 10*3/uL (ref 4.0–10.5)
nRBC: 0 % (ref 0.0–0.2)

## 2023-05-22 MED ORDER — IBUPROFEN 800 MG PO TABS: 800.0000 mg | ORAL_TABLET | Freq: Three times a day (TID) | ORAL | 0 refills | Status: AC | PRN

## 2023-05-22 MED ORDER — CEPHALEXIN 500 MG PO CAPS: 500.0000 mg | ORAL_CAPSULE | Freq: Three times a day (TID) | ORAL | 0 refills | Status: AC

## 2023-05-22 NOTE — Plan of Care (Signed)

## 2023-05-22 NOTE — Discharge Summary (Signed)
Physician Discharge Summary   Patient: Christina Park MRN: [id number] DOB: 12/03/1985  Admit date:     05/18/2023  Discharge date: 05/22/23  Discharge Physician: Alford Highland   PCP: Patient, No Pcp Per   Recommendations at discharge:   Follow-up your medical doctor  Discharge Diagnoses: Principal Problem:   Acute pyelonephritis Active Problems:   Sepsis secondary to UTI (HCC)   Acute right flank pain   Abnormal LFTs   Anemia   Elevated glucose level   Hyponatremia   Hypokalemia   Headache    Hospital Course: 37 year old female presenting with fever and chills and some nausea vomiting and headache.  She is having lower abdominal pain on the right side.  CT scan consistent with pyelonephritis.  8/2.  Patient feeling better today.  Had a fever of 101 last night.  8/3.  Patient had temperature of 100.5 early this am.  Continue iv antibiotics today. 8/4.  Patient feeling better.  No further fever.  Switch over to oral Keflex for completion of course.    Assessment and Plan: * Acute pyelonephritis CT scan consistent with pyelonephritis.  Urine analysis was actually not positive.  He had on urine culture growing 60,000 E. Coli.  Patient was on IV Rocephin while here.  Switch over to oral Keflex upon discharge.   Sepsis secondary to UTI [facility]) Present on admission with fever, leukocytosis and tachycardia.  E. coli growing out of urine culture.  Received Rocephin here and switched over to Keflex upon discharge.   Acute right flank pain Secondary to pyelonephritis   Headache Headache happens when she has the fever.  This has improved.  Hypokalemia Replaced  Hyponatremia normalized  Elevated glucose level Hemoglobin A1c 5.0.  Patient not a diabetic.  Anemia Last hemoglobin 11.1      Abnormal LFTs Secondary to sepsis         Consultants: None Procedures performed: None Disposition: Home Diet recommendation:  Regular diet DISCHARGE  MEDICATION: Allergies as of 05/22/2023       Reactions   No Known Allergies         Medication List     TAKE these medications    cephALEXin 500 MG capsule Commonly known as: KEFLEX Take 1 capsule (500 mg total) by mouth 3 (three) times daily for 4 days.   ibuprofen 800 MG tablet Commonly known as: ADVIL Take 1 tablet (800 mg total) by mouth every 8 (eight) hours as needed. What changed:  when to take this reasons to take this   levonorgestrel 20 MCG/24HR IUD Commonly known as: MIRENA 1 each by Intrauterine route once.   multivitamin capsule Take 1 capsule by mouth daily.        Discharge Exam: Filed Weights   05/18/23 1926 05/19/23 0434  Weight: 95.3 kg 95.3 kg   Physical Exam HENT:     Head: Normocephalic.     Mouth/Throat:     Pharynx: No oropharyngeal exudate.  Eyes:     General: Lids are normal.     Conjunctiva/sclera: Conjunctivae normal.  Cardiovascular:     Rate and Rhythm: Normal rate and regular rhythm.     Heart sounds: Normal heart sounds, S1 normal and S2 normal.  Pulmonary:     Breath sounds: No decreased breath sounds, wheezing, rhonchi or rales.  Abdominal:     Palpations: Abdomen is soft.     Tenderness: There is no abdominal tenderness.  Musculoskeletal:     Right lower leg: No swelling.  Left lower leg: No swelling.  Skin:    General: Skin is warm.     Findings: No rash.  Neurological:     Mental Status: She is alert and oriented to person, place, and time.      Condition at discharge: stable  The results of significant diagnostics from this hospitalization (including imaging, microbiology, ancillary and laboratory) are listed below for reference.   Imaging Studies: CT ABDOMEN PELVIS W CONTRAST  Result Date: 05/18/2023 CLINICAL DATA:  Right lower quadrant pain EXAM: CT ABDOMEN AND PELVIS WITH CONTRAST TECHNIQUE: Multidetector CT imaging of the abdomen and pelvis was performed using the standard protocol following bolus  administration of intravenous contrast. RADIATION DOSE REDUCTION: This exam was performed according to the departmental dose-optimization program which includes automated exposure control, adjustment of the mA and/or kV according to patient size and/or use of iterative reconstruction technique. CONTRAST:  OMNIPAQUE IOHEXOL 350 MG/ML SOLN COMPARISON:  None Available. FINDINGS: Lower chest: No acute abnormality.  Bilateral breast implants are. Hepatobiliary: No focal liver abnormality is seen. Status post cholecystectomy. No biliary dilatation. Pancreas: Unremarkable. No pancreatic ductal dilatation or surrounding inflammatory changes. Spleen: Normal in size without focal abnormality. Adrenals/Urinary Tract: There are patchy areas of hypoattenuation throughout the right kidney. There is mild perinephric fat stranding. There is no hydronephrosis. There is some wall enhancement of the proximal ureter and renal pelvis. No perinephric fluid collection. The adrenal glands, left kidney and bladder are within normal limits. Stomach/Bowel: There are postsurgical changes in the stomach. Appendix appears normal. No evidence of bowel wall thickening, distention, or inflammatory changes. Vascular/Lymphatic: No significant vascular findings are present. No enlarged abdominal or pelvic lymph nodes. Reproductive: There is an IUD in the uterus. Adnexa are within normal limits. Other: There is a small fat containing umbilical hernia. There is no free fluid. Musculoskeletal: No fracture is seen. IMPRESSION: Findings compatible with right-sided pyelonephritis. No hydronephrosis or perinephric fluid collection. Electronically Signed   By: Darliss Cheney M.D.   On: 05/18/2023 22:09   DG Chest 2 View  Result Date: 05/18/2023 CLINICAL DATA:  Fever, headache, cough.  Vomiting. EXAM: CHEST - 2 VIEW COMPARISON:  None Available. FINDINGS: Heart size is normal. Mediastinal shadows are normal. The lungs are clear. No bronchial  thickening. No infiltrate, mass, effusion or collapse. Pulmonary vascularity is normal. No bony abnormality. IMPRESSION: Normal chest radiography. Electronically Signed   By: Paulina Fusi M.D.   On: 05/18/2023 17:42    Microbiology: Results for orders placed or performed during the hospital encounter of 05/18/23  Urine Culture (for pregnant, neutropenic or urologic patients or patients with an indwelling urinary catheter)     Status: Abnormal   Collection Time: 05/18/23 10:06 PM   Specimen: Urine, Clean Catch  Result Value Ref Range Status   Specimen Description   Final    URINE, CLEAN CATCH Performed at [facility], [address], [place], [place] [address]    Special Requests   Final    NONE Performed at [facility], [address] [place], [place] [address]    Culture 60,000 COLONIES/mL ESCHERICHIA COLI (A)  Final   Report Status 05/21/2023 FINAL  Final   Organism ID, Bacteria ESCHERICHIA COLI (A)  Final      Susceptibility   Escherichia coli - MIC*    AMPICILLIN >=32 RESISTANT Resistant     CEFAZOLIN <=4 SENSITIVE Sensitive     CEFEPIME <=0.12 SENSITIVE Sensitive     CEFTRIAXONE <=0.25 SENSITIVE Sensitive  CIPROFLOXACIN <=0.25 SENSITIVE Sensitive     GENTAMICIN <=1 SENSITIVE Sensitive     IMIPENEM <=0.25 SENSITIVE Sensitive     NITROFURANTOIN <=16 SENSITIVE Sensitive     TRIMETH/SULFA <=20 SENSITIVE Sensitive     AMPICILLIN/SULBACTAM >=32 RESISTANT Resistant     PIP/TAZO <=4 SENSITIVE Sensitive     * 60,000 COLONIES/mL ESCHERICHIA COLI  Blood culture (routine x 2)     Status: None (Preliminary result)   Collection Time: 05/19/23  2:13 AM   Specimen: BLOOD  Result Value Ref Range Status   Specimen Description BLOOD BLOOD LEFT HAND  Final   Special Requests   Final    BOTTLES DRAWN AEROBIC AND ANAEROBIC Blood Culture adequate volume   Culture   Final    NO GROWTH 3 DAYS Performed at [facility], [address] [place], [place]  [address]    Report Status PENDING  Incomplete  Blood culture (routine x 2)     Status: None (Preliminary result)   Collection Time: 05/19/23  2:22 AM   Specimen: BLOOD  Result Value Ref Range Status   Specimen Description BLOOD BLOOD RIGHT HAND  Final   Special Requests   Final    BOTTLES DRAWN AEROBIC AND ANAEROBIC Blood Culture adequate volume   Culture   Final    NO GROWTH 3 DAYS Performed at [facility], [address] [place], [place] [address]    Report Status PENDING  Incomplete    Labs: CBC: Recent Labs  Lab 05/18/23 1742 05/18/23 2155 05/19/23 0213 05/19/23 0509 05/22/23 0427  WBC 11.1* 10.5 9.7 11.0* 5.7  NEUTROABS 8.3* 7.9*  --   --  2.1  HGB 13.2 11.7* 10.5* 11.2* 11.1*  HCT 38.5 35.0* 30.3* 33.4* 32.9*  MCV 90.8 92.8 89.9 91.3 90.1  PLT 250 228 201 208 267   Basic Metabolic Panel: Recent Labs  Lab 05/18/23 1742 05/18/23 2155 05/19/23 0509 05/21/23 0555  NA 133* 134* 133* 136  K 3.9 3.5 3.2* 3.8  CL 99 106 105 107  CO2 23 24 20* 23  GLUCOSE 116* 109* 107* 104*  BUN 8 10 7 8   CREATININE 0.88 0.78 0.67 0.62  CALCIUM 8.9 7.9* 8.0* 8.2*   Liver Function Tests: Recent Labs  Lab 05/18/23 2155 05/19/23 0509 05/21/23 0555  AST 103* 82* 62*  ALT 147* 134* 112*  ALKPHOS 89 87 108  BILITOT 1.0 1.2 0.5  PROT 7.1 6.6 6.3*  ALBUMIN 3.3* 3.2* 2.8*   CBG: No results for input(s): "GLUCAP" in the last 168 hours.  Discharge time spent: greater than 30 minutes.  Signed: Alford Highland, MD [facility] 05/22/2023

## 2023-05-22 NOTE — Progress Notes (Signed)
MD order received in [facility] to discharge pt home today; verbally reviewed AVS with pt, Rxs escribed to the [facility] in [place], [place]; no questions voiced by the pt at this time; pt discharged via wheelchair by nursing to [facility]

## 2023-05-22 NOTE — Plan of Care (Signed)

## 2023-05-24 LAB — CULTURE, BLOOD (ROUTINE X 2)
Culture: NO GROWTH
Special Requests: ADEQUATE
